# Patient Record
Sex: Male | Born: 1945 | Race: White | Hispanic: No | Marital: Married | Smoking: Former smoker
Health system: Southern US, Community
[De-identification: ages and names within clinical notes are randomized; demographics above are authoritative.]

## PROBLEM LIST (undated history)

## (undated) DIAGNOSIS — I10 Essential (primary) hypertension: Secondary | ICD-10-CM

## (undated) DIAGNOSIS — K469 Unspecified abdominal hernia without obstruction or gangrene: Secondary | ICD-10-CM

## (undated) DIAGNOSIS — G35 Multiple sclerosis: Secondary | ICD-10-CM

## (undated) DIAGNOSIS — F0390 Unspecified dementia without behavioral disturbance: Secondary | ICD-10-CM

## (undated) DIAGNOSIS — R202 Paresthesia of skin: Secondary | ICD-10-CM

## (undated) HISTORY — DX: Paresthesia of skin: R20.2

## (undated) HISTORY — DX: Essential (primary) hypertension: I10

## (undated) HISTORY — DX: Unspecified abdominal hernia without obstruction or gangrene: K46.9

## (undated) HISTORY — DX: Multiple sclerosis: G35

---

## 2001-10-09 HISTORY — PX: GALLBLADDER SURGERY: SHX652

## 2002-04-04 ENCOUNTER — Encounter: Admission: RE | Admit: 2002-04-04 | Discharge: 2002-04-04 | Payer: Self-pay | Admitting: Family Medicine

## 2002-04-04 ENCOUNTER — Encounter: Payer: Self-pay | Admitting: Family Medicine

## 2002-05-08 ENCOUNTER — Observation Stay (HOSPITAL_COMMUNITY): Admission: RE | Admit: 2002-05-08 | Discharge: 2002-05-08 | Payer: Self-pay | Admitting: *Deleted

## 2002-05-08 ENCOUNTER — Encounter (INDEPENDENT_AMBULATORY_CARE_PROVIDER_SITE_OTHER): Payer: Self-pay | Admitting: Specialist

## 2003-01-07 ENCOUNTER — Ambulatory Visit (HOSPITAL_BASED_OUTPATIENT_CLINIC_OR_DEPARTMENT_OTHER): Admission: RE | Admit: 2003-01-07 | Discharge: 2003-01-07 | Payer: Self-pay | Admitting: Plastic Surgery

## 2003-01-07 ENCOUNTER — Encounter (INDEPENDENT_AMBULATORY_CARE_PROVIDER_SITE_OTHER): Payer: Self-pay | Admitting: Specialist

## 2004-01-20 ENCOUNTER — Observation Stay (HOSPITAL_COMMUNITY): Admission: EM | Admit: 2004-01-20 | Discharge: 2004-01-21 | Payer: Self-pay | Admitting: Emergency Medicine

## 2004-03-24 ENCOUNTER — Ambulatory Visit (HOSPITAL_COMMUNITY): Admission: RE | Admit: 2004-03-24 | Discharge: 2004-03-24 | Payer: Self-pay | Admitting: Family Medicine

## 2004-03-24 ENCOUNTER — Encounter: Payer: Self-pay | Admitting: Cardiology

## 2005-11-10 ENCOUNTER — Ambulatory Visit: Payer: Self-pay | Admitting: Gastroenterology

## 2005-12-22 ENCOUNTER — Ambulatory Visit: Payer: Self-pay | Admitting: Gastroenterology

## 2007-07-08 ENCOUNTER — Encounter: Admission: RE | Admit: 2007-07-08 | Discharge: 2007-07-08 | Payer: Self-pay | Admitting: Family Medicine

## 2007-07-22 ENCOUNTER — Encounter: Admission: RE | Admit: 2007-07-22 | Discharge: 2007-07-22 | Payer: Self-pay | Admitting: Family Medicine

## 2008-01-09 ENCOUNTER — Encounter: Admission: RE | Admit: 2008-01-09 | Discharge: 2008-01-09 | Payer: Self-pay | Admitting: Family Medicine

## 2011-02-10 ENCOUNTER — Other Ambulatory Visit: Payer: Self-pay | Admitting: Dermatology

## 2011-02-24 NOTE — H&P (Signed)
NAME:  Luis Shaffer, Luis Shaffer NO.:  [id number]   MEDICAL RECORD NO.:  [id number]                   PATIENT TYPE:  INP   LOCATION:  1827                                 FACILITY:  [facility]   PHYSICIAN:  Willa Rough, M.D.                  DATE OF BIRTH:  02-Nov-1945   DATE OF ADMISSION:  01/20/2004  DATE OF DISCHARGE:                                HISTORY & PHYSICAL   REASON FOR ADMISSION:  Luis Shaffer is a pleasant 65 year old male, with no  prior history of coronary artery disease, with cardiac risk factors notable  for hypertension, history of hypercholesterolemia, remote tobacco, and  family history of coronary artery disease.  The patient states that he had a  stress Cardiolite some seven years ago at our office which was reportedly  normal.  He has not had any subsequent cardiac followup.  The patient denies  any antecedent history of exertional chest discomfort or dyspnea.  He notes  no recent change from his baseline level of exercise tolerance.   The patient presents to the emergency room, following evaluation by his  primary care physician, with complaint of four episodes of extremely brief  left breast discomfort described as a twinge.  These episodes have all  occurred at rest, one while driving back from work.  He denies any radiation  of the discomfort to the neck or upper extremities, nor does he have  associated nausea, diaphoresis, or dyspnea.  Although he has had some such  episodes in the past, he became concerned today given the increase in  frequency, but not duration.   On admission, he patient denies any chest discomfort.  Electrocardiogram  reveals normal sinus rhythm with an incomplete right bundle branch block and  no acute changes.   ALLERGIES:  No known drug allergies.   MEDICATIONS:  1. Aspirin 81 mg daily.  2. Lisinopril 5 mg daily.   PAST MEDICAL HISTORY:  1. Hypertension.  2. Multiple sclerosis diagnosed in 1998  (quiescent).  3. Status post cholecystectomy.   REVIEW OF SYSTEMS:  Negative for diabetes mellitus, thyroid disease, history  of peptic ulcer disease.  Denies any previous history of myocardial  infarction, congestive heart failure, syncope, or stroke.  Denies exertional  dyspnea, orthopnea, PND, edema, or tachy palpitations.  Denies any recent  development of fevers, chills, productive cough.  No evidence of upper or  lower GI bleeding.  Otherwise as per HPI.  Remaining systems negative.   PHYSICAL EXAMINATION:  VITAL SIGNS:  Blood pressure 146/83, pulse 96,  respirations 18, temperature 98.1, O2 95% on room air.  GENERAL:  A 65 year old man in no apparent distress.  HEENT:  Normocephalic and atraumatic.  NECK:  Preserved bilateral carotid pulses, no bruits.  LUNGS:  Clear to auscultation in all fields.  HEART:  Regular rate and rhythm (S1, S2).  No murmurs, rubs, or gallops.  ABDOMEN:  Soft, nontender.  EXTREMITIES:  Preserved bilateral femoral pulses without bruits; intact  distal pulses.  No pedal edema.  NEUROLOGIC:  No focal deficits.   LABORATORY AND X-RAY DATA:  WBC 0.9, hemoglobin 15.8, hematocrit 45,  platelets 316.  INR 1.0.  Cardiac markers pending.   Admission chest x-ray:  Pending.   IMPRESSION:  1. Atypical chest pain.  2. Multiple cardiac risk factors.     a. Hypertension.     b. History of hypercholesterolemia.     c. Remote tobacco.     d. Family history.  3. Premature ventricular contractions.   PLAN:  1. The patient will be admitted for overnight monitoring and rule out of     myocardial infarction.  2. Serial cardiac markers will be cycled, and an EKG will be repeated in the     morning.  3. In addition to his home regimen of aspirin and Lisinopril, we will start     Lopressor 25 mg b.i.d. and add Protonix as well.  The patient will be     maintained on intravenous heparin in the meantime.  4. If serial cardiac markers are negative, then plan is to  proceed with an     outpatient stress Cardiolite.  This has been scheduled for Thursday,     January 21, 2004, at 11:45 at our office.   The patient is agreeable with this plan and is willing to proceed.      Luis Shaffer, P.A. [facility]                      Willa Rough, M.D.    GS/MEDQ  D:  01/20/2004  T:  01/20/2004  Job:  [id number]   cc:   Luis Shaffer, M.D.  [address]  [place]  [place] [address]  Fax: [phone]

## 2011-02-24 NOTE — Op Note (Signed)
TNAMEJUWON, SCRIPTER                          ACCOUNT NO.:  [id number]   MEDICAL RECORD NO.:  [id number]                   PATIENT TYPE:  OBV   LOCATION:  0442                                 FACILITY:  [facility]   PHYSICIAN:  Vikki Ports, M.D.         DATE OF BIRTH:  1946/07/31   DATE OF PROCEDURE:  05/08/2002  DATE OF DISCHARGE:  05/08/2002                                 OPERATIVE REPORT   PREOPERATIVE DIAGNOSIS:  Symptomatic cholelithiasis.   POSTOPERATIVE DIAGNOSIS:  Symptomatic cholelithiasis, acute cholecystitis.   SURGEON:  Vikki Ports, M.D.   ASSISTANT:  Currie Paris, M.D.   PROCEDURE:  Laparoscopic cholecystectomy with intraoperative cholangiogram.   ANESTHESIA:  General.   DESCRIPTION OF PROCEDURE:  The patient was taken to the operating room,  placed in the supine position.  After adequate anesthesia was induced using  an endotracheal tube, the abdomen was prepped and draped in a normal sterile  fashion.  Using a transverse infraumbilical incision I dissected down to the  fascia.  The fascia was opened vertically and a 0 Vicryl pursestring suture  was placed on the fascial defect.  A Hasson trocar was placed in the abdomen  and the abdomen was insufflated with carbon dioxide to a measurement of 15  mmHg.  Under direct visualization a 10 mm port was placed in the subxiphoid  region; two 5 mm ports were placed in the right abdomen.  The gallbladder  was identified and retracted cephalad.  The gallbladder was very scarred,  contracted, and thick-walled.  There were a number of omental adhesions  which had to be taken down, and the infundibulum was identified and  retracted laterally.  The cystic duct was identified with a good window  created posterior to it, identifying the cystic artery.  Posterior to the  cystic artery a good window was created to the liver bed.  The cystic artery  was triple-clipped and divided, the cystic duct was clipped  up near the  gallbladder, a small ductotomy was made, and cholangiogram was performed.  This showed normal anatomy and no filling defects.  The cystic duct was then  triple-clipped and divided.  The gallbladder was then taken off the  gallbladder bed using Bovie electrocautery.  It was quite intrahepatic, and  on entering the gallbladder a number of stones fell out of the gallbladder.  These were retrieved with a stone scoop and with irrigation.  The  gallbladder was placed in an EndoCatch bag and removed from the umbilical  port.  Adequate hemostasis was ensured.  Pneumoperitoneum was released.  The  infraumbilical fascial defect was closed with the 0 Vicryl pursestring  suture, skin incisions were closed with staples.  The patient tolerated the  procedure well and went to PACU in good condition.  Vikki Ports, M.D.    [facility]/MEDQ  D:  05/09/2002  T:  05/15/2002  Job:  [id number]   cc:   [place]ale [place]. Andrey Campanile, M.D.

## 2011-02-24 NOTE — Discharge Summary (Signed)
Luis Shaffer, Luis Shaffer NO.:  [id number]   MEDICAL RECORD NO.:  [id number]                   PATIENT TYPE:  INP   LOCATION:  4742                                 FACILITY:  [facility]   PHYSICIAN:  Willa Rough, M.D.                  DATE OF BIRTH:  01/21/1946   DATE OF ADMISSION:  01/20/2004  DATE OF DISCHARGE:  01/21/2004                           DISCHARGE SUMMARY - REFERRING   PROCEDURE:  None.   REASON FOR ADMISSION:  Please refer to admission note.   LABORATORY DATA:  Cardiac enzymes; CK-MB and troponin I markers normal (x3).  Electrolytes, renal function, and liver enzymes normal.  CBC normal. Fasting  lipid profile pending.   Admission chest x-ray; mild cardiac enlargement with no acute changes.   HOSPITAL COURSE:  Following presentation to the emergency room for  evaluation of recurrent, left-sided chest discomfort, the patient was  admitted for overnight observation to rule out myocardial infarction.  He  was maintained on aspirin, beta blocker, and intravenous heparin.   Serial cardiac enzymes were all within normal limits.  Repeat  electrocardiogram showed no acute changes.   The patient was cleared for discharge with plans to proceed with scheduled  exercise stress Cardiolite on the morning of discharge at [facility]  at 11:45 a.m.   The patient will be discharged on previous home medication regimen, with the  addition of Protonix.   DISCHARGE INSTRUCTIONS:  Proceed directly to  [facility] for  scheduled stress Cardiolite at 11:30 a.m.  Follow up with Dr. Andrey Campanile in one  to two weeks.   DISCHARGE DIAGNOSES:  1. Atypical chest pain.     A. Normal serial cardiac enzymes.     B. Follow-up outpatient stress Cardiolite, April 14.  2. Hypertension.  3. History of hypercholesterolemia.  4. Asymptomatic premature ventricular contractions.      Gene Serpe, P.A. [facility]                      Willa Rough, M.D.    GS/MEDQ  D:   01/21/2004  T:  01/21/2004  Job:  [id number]   cc:   Vale Haven. Andrey Campanile, M.D.  [address]  [place]  [place] [address]  Fax: [phone]

## 2011-11-22 DIAGNOSIS — Z125 Encounter for screening for malignant neoplasm of prostate: Secondary | ICD-10-CM | POA: Diagnosis not present

## 2011-11-22 DIAGNOSIS — Z79899 Other long term (current) drug therapy: Secondary | ICD-10-CM | POA: Diagnosis not present

## 2011-11-22 DIAGNOSIS — R079 Chest pain, unspecified: Secondary | ICD-10-CM | POA: Diagnosis not present

## 2011-11-22 DIAGNOSIS — E669 Obesity, unspecified: Secondary | ICD-10-CM | POA: Diagnosis not present

## 2011-11-22 DIAGNOSIS — Z0000 Encounter for general adult medical examination without abnormal findings: Secondary | ICD-10-CM | POA: Diagnosis not present

## 2011-11-22 DIAGNOSIS — B351 Tinea unguium: Secondary | ICD-10-CM | POA: Diagnosis not present

## 2011-11-22 DIAGNOSIS — I10 Essential (primary) hypertension: Secondary | ICD-10-CM | POA: Diagnosis not present

## 2011-12-01 DIAGNOSIS — B351 Tinea unguium: Secondary | ICD-10-CM | POA: Diagnosis not present

## 2011-12-01 DIAGNOSIS — M79609 Pain in unspecified limb: Secondary | ICD-10-CM | POA: Diagnosis not present

## 2012-05-22 DIAGNOSIS — I10 Essential (primary) hypertension: Secondary | ICD-10-CM | POA: Diagnosis not present

## 2012-05-22 DIAGNOSIS — M62838 Other muscle spasm: Secondary | ICD-10-CM | POA: Diagnosis not present

## 2012-05-22 DIAGNOSIS — K219 Gastro-esophageal reflux disease without esophagitis: Secondary | ICD-10-CM | POA: Diagnosis not present

## 2012-06-13 DIAGNOSIS — H353190 Nonexudative age-related macular degeneration, unspecified eye, stage unspecified: Secondary | ICD-10-CM | POA: Diagnosis not present

## 2012-06-13 DIAGNOSIS — H538 Other visual disturbances: Secondary | ICD-10-CM | POA: Diagnosis not present

## 2012-06-13 DIAGNOSIS — H2510 Age-related nuclear cataract, unspecified eye: Secondary | ICD-10-CM | POA: Diagnosis not present

## 2012-06-28 DIAGNOSIS — H35369 Drusen (degenerative) of macula, unspecified eye: Secondary | ICD-10-CM | POA: Diagnosis not present

## 2012-06-28 DIAGNOSIS — H43819 Vitreous degeneration, unspecified eye: Secondary | ICD-10-CM | POA: Diagnosis not present

## 2012-06-28 DIAGNOSIS — H353190 Nonexudative age-related macular degeneration, unspecified eye, stage unspecified: Secondary | ICD-10-CM | POA: Diagnosis not present

## 2012-06-28 DIAGNOSIS — H2510 Age-related nuclear cataract, unspecified eye: Secondary | ICD-10-CM | POA: Diagnosis not present

## 2012-08-09 DIAGNOSIS — L821 Other seborrheic keratosis: Secondary | ICD-10-CM | POA: Diagnosis not present

## 2012-08-09 DIAGNOSIS — L570 Actinic keratosis: Secondary | ICD-10-CM | POA: Diagnosis not present

## 2012-11-25 ENCOUNTER — Other Ambulatory Visit: Payer: Self-pay | Admitting: Geriatric Medicine

## 2012-11-25 DIAGNOSIS — Z0000 Encounter for general adult medical examination without abnormal findings: Secondary | ICD-10-CM | POA: Diagnosis not present

## 2012-11-25 DIAGNOSIS — I10 Essential (primary) hypertension: Secondary | ICD-10-CM | POA: Diagnosis not present

## 2012-11-25 DIAGNOSIS — Z79899 Other long term (current) drug therapy: Secondary | ICD-10-CM | POA: Diagnosis not present

## 2012-11-25 DIAGNOSIS — E041 Nontoxic single thyroid nodule: Secondary | ICD-10-CM | POA: Diagnosis not present

## 2012-11-25 DIAGNOSIS — Z1331 Encounter for screening for depression: Secondary | ICD-10-CM | POA: Diagnosis not present

## 2012-11-28 ENCOUNTER — Ambulatory Visit
Admission: RE | Admit: 2012-11-28 | Discharge: 2012-11-28 | Disposition: A | Discharge status: Home / Self Care | Payer: Medicare Other | Source: Ambulatory Visit | Attending: Geriatric Medicine | Admitting: Geriatric Medicine

## 2012-11-28 DIAGNOSIS — E042 Nontoxic multinodular goiter: Secondary | ICD-10-CM | POA: Diagnosis not present

## 2012-11-28 DIAGNOSIS — E041 Nontoxic single thyroid nodule: Secondary | ICD-10-CM

## 2013-01-03 DIAGNOSIS — H353190 Nonexudative age-related macular degeneration, unspecified eye, stage unspecified: Secondary | ICD-10-CM | POA: Diagnosis not present

## 2013-01-03 DIAGNOSIS — H35379 Puckering of macula, unspecified eye: Secondary | ICD-10-CM | POA: Diagnosis not present

## 2013-01-03 DIAGNOSIS — H2510 Age-related nuclear cataract, unspecified eye: Secondary | ICD-10-CM | POA: Diagnosis not present

## 2013-02-17 DIAGNOSIS — L821 Other seborrheic keratosis: Secondary | ICD-10-CM | POA: Diagnosis not present

## 2013-02-17 DIAGNOSIS — D2330 Other benign neoplasm of skin of unspecified part of face: Secondary | ICD-10-CM | POA: Diagnosis not present

## 2013-02-17 DIAGNOSIS — L570 Actinic keratosis: Secondary | ICD-10-CM | POA: Diagnosis not present

## 2013-02-17 DIAGNOSIS — D239 Other benign neoplasm of skin, unspecified: Secondary | ICD-10-CM | POA: Diagnosis not present

## 2013-05-19 DIAGNOSIS — R109 Unspecified abdominal pain: Secondary | ICD-10-CM | POA: Diagnosis not present

## 2013-05-19 DIAGNOSIS — I10 Essential (primary) hypertension: Secondary | ICD-10-CM | POA: Diagnosis not present

## 2013-05-19 DIAGNOSIS — G35 Multiple sclerosis: Secondary | ICD-10-CM | POA: Diagnosis not present

## 2013-05-19 DIAGNOSIS — R209 Unspecified disturbances of skin sensation: Secondary | ICD-10-CM | POA: Diagnosis not present

## 2013-05-19 DIAGNOSIS — M549 Dorsalgia, unspecified: Secondary | ICD-10-CM | POA: Diagnosis not present

## 2013-06-12 ENCOUNTER — Encounter: Payer: Self-pay | Admitting: Neurology

## 2013-06-12 ENCOUNTER — Ambulatory Visit (INDEPENDENT_AMBULATORY_CARE_PROVIDER_SITE_OTHER): Payer: Medicare Other | Admitting: Neurology

## 2013-06-12 VITALS — BP 160/102 | HR 86 | Ht 68.0 in | Wt 205.0 lb

## 2013-06-12 DIAGNOSIS — K469 Unspecified abdominal hernia without obstruction or gangrene: Secondary | ICD-10-CM | POA: Insufficient documentation

## 2013-06-12 DIAGNOSIS — G35 Multiple sclerosis: Secondary | ICD-10-CM | POA: Diagnosis not present

## 2013-06-12 DIAGNOSIS — R202 Paresthesia of skin: Secondary | ICD-10-CM | POA: Insufficient documentation

## 2013-06-12 DIAGNOSIS — E079 Disorder of thyroid, unspecified: Secondary | ICD-10-CM | POA: Insufficient documentation

## 2013-06-12 NOTE — Progress Notes (Signed)
[facility]  PATIENT: Luis Shaffer DOB: 1946-08-23  HISTORICAL  Luis Shaffer is a 67 years old right-handed Caucasian male, referred by his primary care physician Dr. Pete Shaffer for evaluation of multiple sclerosis.  He had past medical history of hypertension, is taking Lisinopril and aspirin,  The diagnosis of multiple sclerosis was made in 1998 by neurologist Dr. 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  Then he lost followup on to September of 2008, he was reading in bed one night, turn off the light, realized that he has lost vision in his left eye, only lasted less than 2 minutes, his vision went back to normal next morning, he was evaluated by Luis Shaffer in November 2008, repeat MRI of the brain showed mild increased lesion load, but he was still highly functional, no visual difficulty, there was no other clinical flareup, visual evoked potential was normal, ultrasound of carotid artery was normal  After discussion,  He worries about the potential side effect of disease modifying medication, he elected not to receive treatment.  He has been doing very well over the past 6 years, no recurrent neurological symptoms, retired in 2012, still highly function at home, driving, exercise regularly without difficulty.  He denied dysarthria, dysphagia or excessive fatigue  He wants to establish neurologic care,  with more options for multiple sclerosis treatment now, he wants to see if he needs to be on any treatment at all,   REVIEW OF SYSTEMS: Full 14 system review of systems performed and notable only for ringing in ears, rash, constipation, numbness, snoring  ALLERGIES: No Known Allergies  HOME MEDICATIONS:  lisinopril ASA   PAST MEDICAL HISTORY: Past Medical History  Diagnosis Date  . Multiple sclerosis   . Paresthesia   . High blood pressure   . Hernia     PAST SURGICAL HISTORY: Past Surgical History  Procedure Laterality Date  . Gallbladder surgery  2003    FAMILY HISTORY: Family History  Problem Relation Age of Onset  . Breast cancer Mother   . Bone cancer Mother   . Kidney failure Father   CAD      Father  SOCIAL HISTORY:  History   Social History  . Marital Status: Married    Spouse Name: Luis Shaffer    Number of Children: 0  . Years of Education: college   Occupational History    Retired   As Civil Service fast streamer, Doctor, hospital job   Social History Main Topics  . Smoking status: Former Smoker    Types: Cigarettes  . Smokeless tobacco: Never Used  . Alcohol Use: 0.6 oz/week    1 Shots of liquor per week  . Drug Use: No  . Sexual Activity: Not on file    Social History Narrative   Patient lives at home with his wife Luis Shaffer). Patient is retired. College education.   Right handed.   Caffeine- one cup of coffee daily.   PHYSICAL EXAM  Filed Vitals:   06/12/13 0838  BP: 160/102  Pulse: 86  Height: 5\' 8"  (1.727 m)  Weight: 205 lb (92.987 kg)    Body mass index is 31.18 kg/(m^2).   Generalized: In no acute distress  Neck: Supple, no carotid bruits   Cardiac: Regular rate rhythm  Pulmonary: Clear to auscultation bilaterally  Musculoskeletal: No deformity  Neurological examination  Mentation: Alert oriented to time, place, history taking, and causual conversation  Cranial nerve II-XII: Pupils were equal round reactive to light  extraocular movements were full, visual field were full on confrontational test. facial sensation and strength were normal. hearing was intact to finger rubbing bilaterally. Uvula tongue midline.  head turning and shoulder shrug and were normal and symmetric.Tongue protrusion into cheek strength was normal.  Motor: normal tone, bulk and strength.  Sensory: Intact to fine touch, pinprick, preserved vibratory sensation, and proprioception at toes.  Coordination: Normal finger to nose, heel-to-shin bilaterally there was no truncal ataxia  Gait: Rising up from seated position without assistance, normal stance, without trunk ataxia, moderate stride, good arm swing, smooth turning, able to perform tiptoe, and heel walking without difficulty.   Romberg signs: Negative  Deep tendon reflexes: Brachioradialis 2/2, biceps 2/2, triceps 2/2, patellar 2/2, Achilles 2/2, plantar responses were flexor bilaterally.   DIAGNOSTIC DATA (LABS, IMAGING, TESTING) - I reviewed patient records, labs, notes, testing and imaging myself where available.     ASSESSMENT AND PLAN  67 years old right-handed Caucasian male, with past medical history of hypertension, diagnosis of definite multiple sclerosis since 1998, clinically, he has been very stable, only mild progression in repeat MRI of brain in 2008, he still highly function, never received any disease modifying medications.  1.  Repeat MRI brain w/wo contrast. 2. will call him report, if there is significant change on repeat MRI of the brain, he will come back to discuss treatment option, otherwise it is okay for him to keep off disease modifying medication at this point 3. please check vitamin B12, thyroid functional, vitamin D. level on next yearly followup    Luis Shaffer, M.D. Ph.D.  [facility] [address] [place], [place] [address] [phone]

## 2013-06-23 ENCOUNTER — Telehealth: Payer: Self-pay | Admitting: Neurology

## 2013-06-23 NOTE — Telephone Encounter (Signed)
Dr Terrace Arabia. Okay to dispense Xanax for MRI?  Please advise.  Thank  You.

## 2013-06-23 NOTE — Telephone Encounter (Signed)
Yes, it is Ok to dispense xanax.

## 2013-06-24 ENCOUNTER — Telehealth: Payer: Self-pay | Admitting: Neurology

## 2013-06-25 NOTE — Telephone Encounter (Signed)
Please see previous note.  Xanax ok to pick up.

## 2013-06-29 ENCOUNTER — Ambulatory Visit
Admission: RE | Admit: 2013-06-29 | Discharge: 2013-06-29 | Disposition: A | Discharge status: Home / Self Care | Payer: Medicare Other | Source: Ambulatory Visit | Attending: Neurology | Admitting: Neurology

## 2013-06-29 DIAGNOSIS — G35 Multiple sclerosis: Secondary | ICD-10-CM

## 2013-06-29 MED ORDER — GADOBENATE DIMEGLUMINE 529 MG/ML IV SOLN
19.0000 mL | Freq: Once | INTRAVENOUS | Status: AC | PRN
  Administered 2013-06-29: 19 mL via INTRAVENOUS

## 2013-07-01 ENCOUNTER — Telehealth: Payer: Self-pay | Admitting: Neurology

## 2013-07-01 NOTE — Telephone Encounter (Signed)
I have called him, MRI brain showed scattered periventricular subcortical cerebellar white matter lesions compatible with multiple sclerosis.  No enhancing lesions are noted. Compared with previous MRI scan from 07/08/2007, lesions appear unchanged with the  except the left parietal periventricular lesion appears to be new. The pituitary gland appears to be slightly enlarged compared with the previous MRI as well.  I have discussed with Luis Shaffer, only one new lesion compared to previous scan in 2008, not enhancing, he is stable clinically.  We elected not to start disease modify treatment, he will call back 2 weeks before Sep 2015 appointment to have a repeat MRI brain scan.

## 2013-08-14 ENCOUNTER — Other Ambulatory Visit: Payer: Self-pay

## 2013-11-26 ENCOUNTER — Other Ambulatory Visit: Payer: Self-pay | Admitting: Geriatric Medicine

## 2013-11-26 ENCOUNTER — Ambulatory Visit
Admission: RE | Admit: 2013-11-26 | Discharge: 2013-11-26 | Disposition: A | Discharge status: Home / Self Care | Payer: Medicare Other | Source: Ambulatory Visit | Attending: Geriatric Medicine | Admitting: Geriatric Medicine

## 2013-11-26 DIAGNOSIS — Z125 Encounter for screening for malignant neoplasm of prostate: Secondary | ICD-10-CM | POA: Diagnosis not present

## 2013-11-26 DIAGNOSIS — E049 Nontoxic goiter, unspecified: Secondary | ICD-10-CM

## 2013-11-26 DIAGNOSIS — M7020 Olecranon bursitis, unspecified elbow: Secondary | ICD-10-CM | POA: Diagnosis not present

## 2013-11-26 DIAGNOSIS — H6120 Impacted cerumen, unspecified ear: Secondary | ICD-10-CM | POA: Diagnosis not present

## 2013-11-26 DIAGNOSIS — Z79899 Other long term (current) drug therapy: Secondary | ICD-10-CM | POA: Diagnosis not present

## 2013-11-26 DIAGNOSIS — Z1331 Encounter for screening for depression: Secondary | ICD-10-CM | POA: Diagnosis not present

## 2013-11-26 DIAGNOSIS — E042 Nontoxic multinodular goiter: Secondary | ICD-10-CM | POA: Diagnosis not present

## 2013-11-26 DIAGNOSIS — Z0000 Encounter for general adult medical examination without abnormal findings: Secondary | ICD-10-CM | POA: Diagnosis not present

## 2013-11-26 DIAGNOSIS — E041 Nontoxic single thyroid nodule: Secondary | ICD-10-CM | POA: Diagnosis not present

## 2013-11-26 DIAGNOSIS — Z23 Encounter for immunization: Secondary | ICD-10-CM | POA: Diagnosis not present

## 2013-11-26 DIAGNOSIS — H9319 Tinnitus, unspecified ear: Secondary | ICD-10-CM | POA: Diagnosis not present

## 2013-12-29 ENCOUNTER — Other Ambulatory Visit: Payer: Self-pay | Admitting: Internal Medicine

## 2013-12-29 ENCOUNTER — Ambulatory Visit
Admission: RE | Admit: 2013-12-29 | Discharge: 2013-12-29 | Disposition: A | Discharge status: Home / Self Care | Payer: Medicare Other | Source: Ambulatory Visit | Attending: Internal Medicine | Admitting: Internal Medicine

## 2013-12-29 DIAGNOSIS — R05 Cough: Secondary | ICD-10-CM

## 2013-12-29 DIAGNOSIS — J9819 Other pulmonary collapse: Secondary | ICD-10-CM | POA: Diagnosis not present

## 2013-12-29 DIAGNOSIS — R059 Cough, unspecified: Secondary | ICD-10-CM

## 2014-02-17 DIAGNOSIS — D1801 Hemangioma of skin and subcutaneous tissue: Secondary | ICD-10-CM | POA: Diagnosis not present

## 2014-02-17 DIAGNOSIS — L821 Other seborrheic keratosis: Secondary | ICD-10-CM | POA: Diagnosis not present

## 2014-02-17 DIAGNOSIS — L820 Inflamed seborrheic keratosis: Secondary | ICD-10-CM | POA: Diagnosis not present

## 2014-06-01 ENCOUNTER — Telehealth: Payer: Self-pay | Admitting: Neurology

## 2014-06-01 NOTE — Telephone Encounter (Signed)
Patient is calling--he has an appointment scheduled with Dr. Krista Blue on 06-18-14 and is supposed to have an MRI scheduled before that visit--please call.

## 2014-06-02 NOTE — Telephone Encounter (Addendum)
Called and spoke to patient CX His follow up because MRI has not been scheduled yet. Patient will need Xanax Pack for MRI. Please call when xanax can be picked up. Renee Please let me know when scheduled so I can schedule him a follow up with Dr.Yan to go over MRI results. Patient aware Dr.Yan is out of the office until 06-03-2014.  Patient states he has MRI every year. Dr.Yan please place orders.

## 2014-06-02 NOTE — Telephone Encounter (Signed)
Patient Called back and stated for now he wants to hold off on MRI because his wife has Breast Cancer. Patient will call back when he is ready to schedule his MRI/ Follow Up. Patient Wants Dr.Yan to be aware he is holding off.

## 2014-06-03 NOTE — Telephone Encounter (Signed)
Reviewed chart, he has MS. Not on any disease modifying medications

## 2014-06-12 ENCOUNTER — Ambulatory Visit: Payer: Medicare Other | Admitting: Nurse Practitioner

## 2014-06-12 ENCOUNTER — Ambulatory Visit: Payer: Medicare Other | Admitting: Neurology

## 2014-06-18 ENCOUNTER — Ambulatory Visit: Payer: Medicare Other | Admitting: Neurology

## 2014-06-24 DIAGNOSIS — I10 Essential (primary) hypertension: Secondary | ICD-10-CM | POA: Diagnosis not present

## 2014-06-24 DIAGNOSIS — N410 Acute prostatitis: Secondary | ICD-10-CM | POA: Diagnosis not present

## 2014-06-24 DIAGNOSIS — Z23 Encounter for immunization: Secondary | ICD-10-CM | POA: Diagnosis not present

## 2014-06-24 DIAGNOSIS — E669 Obesity, unspecified: Secondary | ICD-10-CM | POA: Diagnosis not present

## 2014-06-24 DIAGNOSIS — Z6829 Body mass index (BMI) 29.0-29.9, adult: Secondary | ICD-10-CM | POA: Diagnosis not present

## 2014-06-27 DIAGNOSIS — H524 Presbyopia: Secondary | ICD-10-CM | POA: Diagnosis not present

## 2014-06-27 DIAGNOSIS — H2510 Age-related nuclear cataract, unspecified eye: Secondary | ICD-10-CM | POA: Diagnosis not present

## 2014-06-27 DIAGNOSIS — H353190 Nonexudative age-related macular degeneration, unspecified eye, stage unspecified: Secondary | ICD-10-CM | POA: Diagnosis not present

## 2014-06-27 DIAGNOSIS — H5015 Alternating exotropia: Secondary | ICD-10-CM | POA: Diagnosis not present

## 2014-07-15 DIAGNOSIS — R3912 Poor urinary stream: Secondary | ICD-10-CM | POA: Diagnosis not present

## 2014-07-15 DIAGNOSIS — N401 Enlarged prostate with lower urinary tract symptoms: Secondary | ICD-10-CM | POA: Diagnosis not present

## 2014-07-15 DIAGNOSIS — R350 Frequency of micturition: Secondary | ICD-10-CM | POA: Diagnosis not present

## 2014-07-15 DIAGNOSIS — R3915 Urgency of urination: Secondary | ICD-10-CM | POA: Diagnosis not present

## 2014-07-24 ENCOUNTER — Other Ambulatory Visit: Payer: Self-pay

## 2014-11-13 ENCOUNTER — Encounter (HOSPITAL_COMMUNITY): Payer: Self-pay | Admitting: Emergency Medicine

## 2014-11-13 ENCOUNTER — Emergency Department (HOSPITAL_COMMUNITY)
Admission: EM | Admit: 2014-11-13 | Discharge: 2014-11-14 | Disposition: A | Discharge status: Home / Self Care | Payer: Medicare Other | Attending: Emergency Medicine | Admitting: Emergency Medicine

## 2014-11-13 DIAGNOSIS — Z79899 Other long term (current) drug therapy: Secondary | ICD-10-CM | POA: Diagnosis not present

## 2014-11-13 DIAGNOSIS — Z9889 Other specified postprocedural states: Secondary | ICD-10-CM | POA: Diagnosis not present

## 2014-11-13 DIAGNOSIS — Z87891 Personal history of nicotine dependence: Secondary | ICD-10-CM | POA: Diagnosis not present

## 2014-11-13 DIAGNOSIS — G35 Multiple sclerosis: Secondary | ICD-10-CM | POA: Insufficient documentation

## 2014-11-13 DIAGNOSIS — K5901 Slow transit constipation: Secondary | ICD-10-CM | POA: Insufficient documentation

## 2014-11-13 DIAGNOSIS — K5641 Fecal impaction: Secondary | ICD-10-CM | POA: Diagnosis not present

## 2014-11-13 DIAGNOSIS — R339 Retention of urine, unspecified: Secondary | ICD-10-CM | POA: Diagnosis not present

## 2014-11-13 DIAGNOSIS — R159 Full incontinence of feces: Secondary | ICD-10-CM | POA: Diagnosis present

## 2014-11-13 DIAGNOSIS — Z7982 Long term (current) use of aspirin: Secondary | ICD-10-CM | POA: Diagnosis not present

## 2014-11-13 LAB — BASIC METABOLIC PANEL
ANION GAP: 8 (ref 5–15)
BUN: 17 mg/dL (ref 6–23)
CALCIUM: 9.3 mg/dL (ref 8.4–10.5)
CHLORIDE: 105 mmol/L (ref 96–112)
CO2: 23 mmol/L (ref 19–32)
CREATININE: 1.03 mg/dL (ref 0.50–1.35)
GFR calc Af Amer: 83 mL/min — ABNORMAL LOW (ref >90)
GFR calc non Af Amer: 72 mL/min — ABNORMAL LOW (ref >90)
GLUCOSE: 125 mg/dL — AB (ref 70–99)
POTASSIUM: 3.9 mmol/L (ref 3.5–5.1)
SODIUM: 136 mmol/L (ref 135–145)

## 2014-11-13 LAB — URINALYSIS, ROUTINE W REFLEX MICROSCOPIC
BILIRUBIN URINE: NEGATIVE
GLUCOSE, UA: NEGATIVE mg/dL
KETONES UR: NEGATIVE mg/dL
LEUKOCYTES UA: NEGATIVE
NITRITE: NEGATIVE
PH: 5 (ref 5.0–8.0)
Protein, ur: NEGATIVE mg/dL
Specific Gravity, Urine: 1.02 (ref 1.005–1.030)
UROBILINOGEN UA: 0.2 mg/dL (ref 0.0–1.0)

## 2014-11-13 LAB — CBC WITH DIFFERENTIAL/PLATELET
BASOS PCT: 0 % (ref 0–1)
Basophils Absolute: 0 10*3/uL (ref 0.0–0.1)
EOS ABS: 0 10*3/uL (ref 0.0–0.7)
EOS PCT: 0 % (ref 0–5)
HEMATOCRIT: 47.8 % (ref 39.0–52.0)
Hemoglobin: 16.7 g/dL (ref 13.0–17.0)
LYMPHS ABS: 1.2 10*3/uL (ref 0.7–4.0)
Lymphocytes Relative: 7 % — ABNORMAL LOW (ref 12–46)
MCH: 33.5 pg (ref 26.0–34.0)
MCHC: 34.9 g/dL (ref 30.0–36.0)
MCV: 95.8 fL (ref 78.0–100.0)
MONO ABS: 1.2 10*3/uL — AB (ref 0.1–1.0)
Monocytes Relative: 7 % (ref 3–12)
Neutro Abs: 14.9 10*3/uL — ABNORMAL HIGH (ref 1.7–7.7)
Neutrophils Relative %: 86 % — ABNORMAL HIGH (ref 43–77)
PLATELETS: 304 10*3/uL (ref 150–400)
RBC: 4.99 MIL/uL (ref 4.22–5.81)
RDW: 13 % (ref 11.5–15.5)
WBC: 17.3 10*3/uL — AB (ref 4.0–10.5)

## 2014-11-13 LAB — URINE MICROSCOPIC-ADD ON

## 2014-11-13 MED ORDER — LIDOCAINE HCL 4 % EX SOLN
Freq: Once | CUTANEOUS | Status: DC
  Filled 2014-11-13: qty 50

## 2014-11-13 NOTE — ED Provider Notes (Signed)
CSN: [id number]     Arrival date & time 11/13/14  1752 History   First MD Initiated Contact with Patient 11/13/14 1842     Chief Complaint  Patient presents with  . Bowel Incontinence   . Urinary Retention     (Consider location/radiation/quality/duration/timing/severity/associated sxs/prior Treatment) The history is provided by the patient.     Luis Shaffer is a 69 y.o. male who is here for evaluation of difficulty urinating for about 12 hours, and fecal constipation.  The symptoms have been ongoing and progressive for 2 days.  Has not had fever, chills, vomiting, cough, shortness breath, chest pain or abdominal pain.  He has a history of chronic constipation and he takes Senokot- S, about once a month for it.  He has not tried any other treatment.  There are no other known modifying factors.   Past Medical History  Diagnosis Date  . Multiple sclerosis   . Paresthesia   . High blood pressure   . Hernia    Past Surgical History  Procedure Laterality Date  . Gallbladder surgery  2003   Family History  Problem Relation Age of Onset  . Breast cancer Mother   . Bone cancer Mother   . Kidney failure Father    History  Substance Use Topics  . Smoking status: Former Smoker    Types: Cigarettes  . Smokeless tobacco: Never Used     Comment: Quit 40 years ago  . Alcohol Use: 0.6 oz/week    1 Shots of liquor per week     Comment: one drink daily.    Review of Systems  All other systems reviewed and are negative.     Allergies  Review of patient's allergies indicates no known allergies.  Home Medications   Prior to Admission medications   Medication Sig Start Date End Date Taking? Authorizing Provider  aspirin 81 MG tablet Take 81 mg by mouth daily.   Yes Historical Provider, MD  lisinopril (PRINIVIL,ZESTRIL) 20 MG tablet Take 10 mg by mouth daily.  04/29/13  Yes Historical Provider, MD  Multiple Vitamins-Minerals (MULTIVITAMIN PO) Take by mouth daily.   Yes  Historical Provider, MD  Multiple Vitamins-Minerals (PRESERVISION AREDS PO) Take by mouth daily.   Yes Historical Provider, MD  Omega-3 Fatty Acids (FISH OIL) 500 MG CAPS Take by mouth 4 (four) times daily.    Yes Historical Provider, MD  SF 1.1 % GEL dental gel Place 1 application onto teeth at bedtime.  06/02/13  Yes Historical Provider, MD   BP 136/76 mmHg  Pulse 107  Temp(Src) 97.8 F (36.6 C) (Oral)  Resp 18  SpO2 95% Physical Exam  Constitutional: He is oriented to person, place, and time. He appears well-developed and well-nourished.  HENT:  Head: Normocephalic and atraumatic.  Right Ear: External ear normal.  Left Ear: External ear normal.  Eyes: Conjunctivae and EOM are normal. Pupils are equal, round, and reactive to light.  Neck: Normal range of motion and phonation normal. Neck supple.  Cardiovascular: Normal rate, regular rhythm and normal heart sounds.   Pulmonary/Chest: Effort normal and breath sounds normal. He exhibits no bony tenderness.  Abdominal: Soft. There is no tenderness.  Genitourinary:  Normal anus.  Fecal impaction in rectum with brown stool  Musculoskeletal: Normal range of motion.  Neurological: He is alert and oriented to person, place, and time. No cranial nerve deficit or sensory deficit. He exhibits normal muscle tone. Coordination normal.  Skin: Skin is warm, dry and intact.  Psychiatric: He has a normal mood and affect. His behavior is normal. Judgment and thought content normal.  Nursing note and vitals reviewed.   ED Course  Procedures (including critical care time)  Medications - No data to display  Patient Vitals for the past 24 hrs:  BP Temp Temp src Pulse Resp SpO2  11/14/14 0140 136/76 mmHg - - 107 18 95 %  11/13/14 2230 (!) 128/54 mmHg - - 104 21 96 %  11/13/14 2028 137/75 mmHg - - 109 25 97 %  11/13/14 1833 - 97.8 F (36.6 C) Oral - - -  11/13/14 1808 123/75 mmHg - - (!) 122 18 95 %    21:40- after insertion of Xylocaine jelly,  was manually disimpacted, by me.  Moderate amount of hard stool was recovered.  There was gross blood on some of the more proximal stool, noted during the disimpaction.    11:56 PM Reevaluation with update and discussion. After initial assessment and treatment, an updated evaluation reveals after the substance enema, he had minimal relief of stool.  I repeated the digital rectal exam.  There is no low fecal impaction but there continues to be hard stool palpated at the extent of my reach.  Second enema ordered. [place] Review Labs Reviewed  BASIC METABOLIC PANEL - Abnormal; Notable for the following:    Glucose, Bld 125 (*)    GFR calc non Af Amer 72 (*)    GFR calc Af Amer 83 (*)    All other components within normal limits  CBC WITH DIFFERENTIAL/PLATELET - Abnormal; Notable for the following:    WBC 17.3 (*)    Neutrophils Relative % 86 (*)    Neutro Abs 14.9 (*)    Lymphocytes Relative 7 (*)    Monocytes Absolute 1.2 (*)    All other components within normal limits  URINALYSIS, ROUTINE W REFLEX MICROSCOPIC - Abnormal; Notable for the following:    Hgb urine dipstick TRACE (*)    All other components within normal limits  URINE CULTURE  URINE MICROSCOPIC-ADD ON    Imaging Review No results found.   EKG Interpretation None      MDM   Final diagnoses:  Fecal impaction  Slow transit constipation    Constipation with fecal impaction. Pt., required multiple enemas to improve stool output.   Nursing Notes Reviewed/ Care Coordinated Applicable Imaging Reviewed Interpretation of Laboratory Data incorporated into ED treatment  Care to Dr. Florina Ou, to consider d/c after second enema; at shift change.    Richarda Blade, MD 11/14/14 1230

## 2014-11-13 NOTE — ED Notes (Addendum)
Pt with Hx of urinary frequency c/o being unable to void from last night until just now when he succeeded in voiding, also c/o constipation and pain in his rectum when he sits and minor incontinence of brown feces-scented liquid from the rectum when he sits. Pt states he also managed to have a small incontinent bowel movement in the waiting room. Pt is normally continent of bowel and bladder.

## 2014-11-14 NOTE — ED Provider Notes (Signed)
1:48 AM Patient feeling significant relief after large bowel movement. Wishes to go home at this time.  Wynetta Fines, MD 11/14/14 [phone]

## 2014-11-14 NOTE — ED Notes (Signed)
Per pt he had a BM and no longer feels any pressure on his rectum.

## 2014-11-14 NOTE — ED Notes (Signed)
Pt is sitting on the bedside commode

## 2014-11-15 LAB — URINE CULTURE: SPECIAL REQUESTS: NORMAL

## 2014-12-02 DIAGNOSIS — I10 Essential (primary) hypertension: Secondary | ICD-10-CM | POA: Diagnosis not present

## 2014-12-02 DIAGNOSIS — Z0000 Encounter for general adult medical examination without abnormal findings: Secondary | ICD-10-CM | POA: Diagnosis not present

## 2014-12-02 DIAGNOSIS — G35 Multiple sclerosis: Secondary | ICD-10-CM | POA: Diagnosis not present

## 2014-12-02 DIAGNOSIS — Z79899 Other long term (current) drug therapy: Secondary | ICD-10-CM | POA: Diagnosis not present

## 2014-12-02 DIAGNOSIS — Z1389 Encounter for screening for other disorder: Secondary | ICD-10-CM | POA: Diagnosis not present

## 2014-12-16 DIAGNOSIS — R3916 Straining to void: Secondary | ICD-10-CM | POA: Diagnosis not present

## 2014-12-16 DIAGNOSIS — N3941 Urge incontinence: Secondary | ICD-10-CM | POA: Diagnosis not present

## 2014-12-16 DIAGNOSIS — N401 Enlarged prostate with lower urinary tract symptoms: Secondary | ICD-10-CM | POA: Diagnosis not present

## 2015-02-22 DIAGNOSIS — L820 Inflamed seborrheic keratosis: Secondary | ICD-10-CM | POA: Diagnosis not present

## 2015-02-22 DIAGNOSIS — L821 Other seborrheic keratosis: Secondary | ICD-10-CM | POA: Diagnosis not present

## 2015-02-22 DIAGNOSIS — L570 Actinic keratosis: Secondary | ICD-10-CM | POA: Diagnosis not present

## 2015-02-22 DIAGNOSIS — L218 Other seborrheic dermatitis: Secondary | ICD-10-CM | POA: Diagnosis not present

## 2015-02-22 DIAGNOSIS — D1801 Hemangioma of skin and subcutaneous tissue: Secondary | ICD-10-CM | POA: Diagnosis not present

## 2015-04-05 ENCOUNTER — Other Ambulatory Visit: Payer: Self-pay

## 2015-06-07 DIAGNOSIS — I10 Essential (primary) hypertension: Secondary | ICD-10-CM | POA: Diagnosis not present

## 2015-06-07 DIAGNOSIS — Z23 Encounter for immunization: Secondary | ICD-10-CM | POA: Diagnosis not present

## 2015-06-08 ENCOUNTER — Ambulatory Visit (INDEPENDENT_AMBULATORY_CARE_PROVIDER_SITE_OTHER): Payer: Medicare Other | Admitting: Podiatry

## 2015-06-08 ENCOUNTER — Encounter: Payer: Self-pay | Admitting: Podiatry

## 2015-06-08 VITALS — BP 147/93 | HR 90

## 2015-06-08 DIAGNOSIS — L84 Corns and callosities: Secondary | ICD-10-CM | POA: Diagnosis not present

## 2015-06-08 DIAGNOSIS — M79606 Pain in leg, unspecified: Secondary | ICD-10-CM | POA: Diagnosis not present

## 2015-06-08 DIAGNOSIS — B351 Tinea unguium: Secondary | ICD-10-CM | POA: Diagnosis not present

## 2015-06-08 NOTE — Progress Notes (Signed)
   Subjective:    Patient ID: Luis Shaffer, male    DOB: Nov 07, 1945, 69 y.o.   MRN: [id number]  HPI Pt presents with bilateral callus on medial side of big toes   Review of Systems  All other systems reviewed and are negative.      Objective:   Physical Exam        Assessment & Plan:

## 2015-06-09 NOTE — Progress Notes (Signed)
Subjective:     Patient ID: Luis Shaffer, male   DOB: 07/17/1946, 69 y.o.   MRN: [id number]  HPI patient states I have nails on both my feet that bother me and lesions on the inside of the big toes bilateral that become sore. I've tried to trim them and tried trimming nails but I cannot   Review of Systems     Objective:   Physical Exam Neurovascular status unchanged with thick yellow brittle nailbeds 1-5 both feet with the first second and third nails of both feet being tender in the corner and keratotic lesion of the hallux bilateral medial side with pain right over left    Assessment:     Mycotic nail infection symptomatic bilateral and keratotic lesion bilateral    Plan:     Debrided nailbeds on both feet taking out the corners and lesions on the big toe both feet that patient tolerated well and reappoint 3 months for recheck

## 2015-06-28 DIAGNOSIS — H11153 Pinguecula, bilateral: Secondary | ICD-10-CM | POA: Diagnosis not present

## 2015-06-28 DIAGNOSIS — H25013 Cortical age-related cataract, bilateral: Secondary | ICD-10-CM | POA: Diagnosis not present

## 2015-06-28 DIAGNOSIS — H01003 Unspecified blepharitis right eye, unspecified eyelid: Secondary | ICD-10-CM | POA: Diagnosis not present

## 2015-06-28 DIAGNOSIS — H3531 Nonexudative age-related macular degeneration: Secondary | ICD-10-CM | POA: Diagnosis not present

## 2015-06-30 DIAGNOSIS — H5021 Vertical strabismus, right eye: Secondary | ICD-10-CM | POA: Diagnosis not present

## 2015-06-30 DIAGNOSIS — H5213 Myopia, bilateral: Secondary | ICD-10-CM | POA: Diagnosis not present

## 2015-06-30 DIAGNOSIS — H5034 Intermittent alternating exotropia: Secondary | ICD-10-CM | POA: Diagnosis not present

## 2015-07-09 DIAGNOSIS — R3916 Straining to void: Secondary | ICD-10-CM | POA: Diagnosis not present

## 2015-07-09 DIAGNOSIS — N401 Enlarged prostate with lower urinary tract symptoms: Secondary | ICD-10-CM | POA: Diagnosis not present

## 2015-07-09 DIAGNOSIS — N3941 Urge incontinence: Secondary | ICD-10-CM | POA: Diagnosis not present

## 2015-07-19 DIAGNOSIS — M546 Pain in thoracic spine: Secondary | ICD-10-CM | POA: Diagnosis not present

## 2015-08-02 ENCOUNTER — Telehealth: Payer: Self-pay | Admitting: Neurology

## 2015-08-02 DIAGNOSIS — G35 Multiple sclerosis: Secondary | ICD-10-CM | POA: Diagnosis not present

## 2015-08-02 DIAGNOSIS — R410 Disorientation, unspecified: Secondary | ICD-10-CM | POA: Diagnosis not present

## 2015-08-02 DIAGNOSIS — F329 Major depressive disorder, single episode, unspecified: Secondary | ICD-10-CM | POA: Diagnosis not present

## 2015-08-02 NOTE — Telephone Encounter (Signed)
[facility] Coup, NP (Dr. Felipa Eth) [phone] called to request appointment with Dr. Krista Blue due to deterioration in function/MS. Scheduled 1st available with Dr. Krista Blue 08/25/15 2:30pm. They would like to get patient in sooner.

## 2015-08-03 NOTE — Telephone Encounter (Signed)
Spoke to Luis Shaffer - patient's appointment has been moved to an earlier date.  She is going to call and inform him of the new appointment time.

## 2015-08-09 ENCOUNTER — Ambulatory Visit (INDEPENDENT_AMBULATORY_CARE_PROVIDER_SITE_OTHER): Payer: Medicare Other | Admitting: Neurology

## 2015-08-09 ENCOUNTER — Encounter: Payer: Self-pay | Admitting: Neurology

## 2015-08-09 VITALS — BP 131/79 | HR 95 | Ht 68.0 in | Wt 171.0 lb

## 2015-08-09 DIAGNOSIS — G20 Parkinson's disease: Secondary | ICD-10-CM

## 2015-08-09 DIAGNOSIS — G35 Multiple sclerosis: Secondary | ICD-10-CM

## 2015-08-09 DIAGNOSIS — R269 Unspecified abnormalities of gait and mobility: Secondary | ICD-10-CM

## 2015-08-09 MED ORDER — CARBIDOPA-LEVODOPA 25-100 MG PO TABS: 1.0000 | ORAL_TABLET | Freq: Three times a day (TID) | ORAL | Status: DC

## 2015-08-09 MED ORDER — CLONAZEPAM 1 MG PO TABS
1.0000 mg | ORAL_TABLET | Freq: Every day | ORAL | Status: DC
Start: 2015-08-09 — End: 2016-12-18

## 2015-08-09 NOTE — Progress Notes (Signed)
PATIENT: Luis Shaffer DOB: [birth year]  Chief Complaint  Patient presents with  . Multiple Sclerosis    He is here with brother, Luis Shaffer.  Luis Shaffer lost his wife two weeks ago.  His walking has been declining over two years. He has had several falls and at one point, states he had to crawl around his house.  He wakes up multiple times during the night and has to urinate frequently.Marland Kitchen  He has experienced weight loss.  He has had family staying with him since his wife's death.     HISTORICAL  Luis Shaffer is a 69 years old right-handed male, accompanied by his brother Luis Shaffer at today's clinical visit  I saw him previously for relapsing remediating multiple sclerosis last visit was in 2014,his primary care physician is Dr. Felipa Shaffer  He had a past medical history of hypertension, the diagnosis of multiple sclerosis was made in 1998 by neurologist Dr. Candis Shaffer, practicing at [facility], he presented with sudden onset right facial hand and foot numbness, lasting for one month, MRI of the brain in February 1998 showed 3 lesions, one in the right middle cerebellum peduncle, one in the right frontal subcortical white matter, and enhancing lesion in the left pon, further workup including lumbar puncture was positive for oligoclonal band, based on the findings, he was diagnosed with definite multiple sclerosis, however he seems to have fairly benign course, they elected not to treat him with disease modifying medication at that time. Then he lost followup on to September of 2008, he was reading in bed one night, turn off the light, realized that he has lost vision in his left eye, only lasted less than 2 minutes, his vision went back to normal next morning, he was evaluated by Dr. Ron Shaffer in November 2008, repeat MRI of the brain showed mild increased lesion load, but he was still highly functional, no visual difficulty, there was no other clinical flareup, visual evoked  potential was normal, ultrasound of carotid artery was normal  After discussion, He worries about the potential side effect of disease modifying medication, he elected not to receive treatment.  He has been doing very well over the past 6 years, no recurrent neurological symptoms, retired in 2012, still highly function at home, driving, exercise regularly without difficulty. He denied dysarthria, dysphagia or excessive fatigue  UPDATE Aug 09 2015: He has lost follow-up over the past 2 years, brother Luis Shaffer reported that he was noted to have gradual onset gait difficulty since December 2015, gravida difficulty getting worse, more noticeable since his wife passed away few months ago,when his brother became more involving his care, he was noted to not be able to attend his personal needs, confused at evening time, dehydrated, difficulty walking, now he is under multiple family members care, he is able to eat regularly, he has become much stronger, Ambulate without assistance, but he remain occasionally confused, forgetful, especially at evening time,mild gait difficulty, mild constipation,Zoloft was stopped.it has caused him to have increased confusion.  I have reviewed MRI brain with without contrast was patient in September 2014: scattered periventricular subcortical cerebellar white matter lesions compatible with atherosclerosis. No enhancing lesions are noted. Compared with previous MRI scan from 07/08/2007 to the lesions appear unchanged except the left parietal periventricular lesion appears to be new. The pituitary gland appears to be slightly enlarged compared with the previous MRI as well.   REVIEW OF SYSTEMS: Full 14 system review of systems performed and notable only for  ringing ears, frequent awakening, walking difficulty  ALLERGIES: No Known Allergies  HOME MEDICATIONS: Current Outpatient Prescriptions  Medication Sig Dispense Refill  . lisinopril (PRINIVIL,ZESTRIL) 20 MG tablet Take 10  mg by mouth daily.     . traMADol (ULTRAM) 50 MG tablet every 6 (six) hours as needed.  0  . VESICARE 5 MG tablet daily.  10   No current facility-administered medications for this visit.    PAST MEDICAL HISTORY: Past Medical History  Diagnosis Date  . Multiple sclerosis (Luis Shaffer)   . Paresthesia   . High blood pressure   . Hernia     PAST SURGICAL HISTORY: Past Surgical History  Procedure Laterality Date  . Gallbladder surgery  2003    FAMILY HISTORY: Family History  Problem Relation Age of Onset  . Breast cancer Mother   . Bone cancer Mother   . Kidney failure Father     SOCIAL HISTORY:  Social History   Social History  . Marital Status: Married    Spouse Name: Luis Shaffer  . Number of Children: 0  . Years of Education: college   Occupational History  .      Retired   Social History Main Topics  . Smoking status: Former Smoker    Types: Cigarettes  . Smokeless tobacco: Never Used     Comment: Quit 40 years ago  . Alcohol Use: 0.6 oz/week    1 Shots of liquor per week     Comment: one drink daily.  . Drug Use: No  . Sexual Activity: Not on file   Other Topics Concern  . Not on file   Social History Narrative   Patient is retired. College education.   Patient's wife passed away in 14-Jul-2015.   Right handed.   Caffeine- one cup of coffee daily.     PHYSICAL EXAM   Filed Vitals:   08/09/15 1031  BP: 131/79  Pulse: 95  Height: 5\' 8"  (1.727 m)  Weight: 171 lb (77.565 kg)    Not recorded      Body mass index is 26.01 kg/(m^2).  PHYSICAL EXAMNIATION:  Gen: NAD, conversant, well nourised, obese, well groomed                     Cardiovascular: Regular rate rhythm, no peripheral edema, warm, nontender. Eyes: Conjunctivae clear without exudates or hemorrhage Neck: Supple, no carotid bruise. Pulmonary: Clear to auscultation bilaterally   NEUROLOGICAL EXAM:  MENTAL STATUS: Speech: Decreased spontaneous speech, rely on his brother to answer  questions. Cognition:     Orientation to time, place and person     Normal recent and remote memory     Normal Attention span and concentration     Normal Language, naming, repeating,spontaneous speech     Fund of knowledge   CRANIAL NERVES: CN II: Visual fields are full to confrontation. Fundoscopic exam is normal with sharp discs and no vascular changes. Pupils are round equal and briskly reactive to light. CN III, IV, VI: extraocular movement are normal. No ptosis. CN V: Facial sensation is intact to pinprick in all 3 divisions bilaterally. Corneal responses are intact.  CN VII: Face is symmetric with normal eye closure and smile. CN VIII: Hearing is normal to rubbing fingers CN IX, X: Palate elevates symmetrically. Phonation is normal. CN XI: Head turning and shoulder shrug are intact CN XII: Tongue is midline with normal movements and no atrophy.  MOTOR: He has mild left hand resting tremor, mild limb  and nuchal muscle rigidity, he has no significant weakness  REFLEXES: Reflexes are 2+ and symmetric at the biceps, triceps, knees, and ankles. Plantar responses are flexor.  SENSORY: Intact to light touch, pinprick, position sense, and vibration sense are intact in fingers and toes.  COORDINATION: Rapid alternating movements and fine finger movements are intact. There is no dysmetria on finger-to-nose and heel-knee-shin.    GAIT/STANCE: Need to push up to get up from seated position, decreased bilateral arm swing, stoop forward posturing, small stride, mild enblock turning, moderate retropulsed instability   DIAGNOSTIC DATA (LABS, IMAGING, TESTING) - I reviewed patient records, labs, notes, testing and imaging myself where available.   ASSESSMENT AND PLAN  Mahonri Seiden is a 69 y.o. male  With past medical history of hypertension Relapsing Remitting multiple sclerosis since 1998  Has never been treated with long-term immunomodulation therapy Rapid decline gait  difficulty,With parkinsonian features  Started Sinemet 25/100 mg 3 times a day  Repeat MRI of the brain,cervical spine  Physical therapy    Marcial Pacas, M.D. Ph.D.  [facility] [address], [place], [place] [address] Ph: [phone] Fax: [phone]  CC: Referring Provider

## 2015-08-11 ENCOUNTER — Emergency Department (HOSPITAL_COMMUNITY)
Admission: EM | Admit: 2015-08-11 | Discharge: 2015-08-11 | Disposition: A | Discharge status: Home / Self Care | Payer: Medicare Other | Attending: Emergency Medicine | Admitting: Emergency Medicine

## 2015-08-11 ENCOUNTER — Telehealth: Payer: Self-pay | Admitting: Neurology

## 2015-08-11 ENCOUNTER — Emergency Department (HOSPITAL_COMMUNITY): Payer: Medicare Other

## 2015-08-11 DIAGNOSIS — Z87891 Personal history of nicotine dependence: Secondary | ICD-10-CM | POA: Diagnosis not present

## 2015-08-11 DIAGNOSIS — R531 Weakness: Secondary | ICD-10-CM | POA: Diagnosis not present

## 2015-08-11 DIAGNOSIS — Z8669 Personal history of other diseases of the nervous system and sense organs: Secondary | ICD-10-CM | POA: Diagnosis not present

## 2015-08-11 DIAGNOSIS — Z79899 Other long term (current) drug therapy: Secondary | ICD-10-CM | POA: Insufficient documentation

## 2015-08-11 DIAGNOSIS — L539 Erythematous condition, unspecified: Secondary | ICD-10-CM | POA: Diagnosis not present

## 2015-08-11 DIAGNOSIS — I10 Essential (primary) hypertension: Secondary | ICD-10-CM | POA: Insufficient documentation

## 2015-08-11 DIAGNOSIS — R4182 Altered mental status, unspecified: Secondary | ICD-10-CM | POA: Diagnosis not present

## 2015-08-11 DIAGNOSIS — Z8719 Personal history of other diseases of the digestive system: Secondary | ICD-10-CM | POA: Diagnosis not present

## 2015-08-11 DIAGNOSIS — R410 Disorientation, unspecified: Secondary | ICD-10-CM | POA: Diagnosis not present

## 2015-08-11 DIAGNOSIS — M6281 Muscle weakness (generalized): Secondary | ICD-10-CM | POA: Diagnosis not present

## 2015-08-11 DIAGNOSIS — M549 Dorsalgia, unspecified: Secondary | ICD-10-CM | POA: Insufficient documentation

## 2015-08-11 DIAGNOSIS — R262 Difficulty in walking, not elsewhere classified: Secondary | ICD-10-CM | POA: Diagnosis not present

## 2015-08-11 LAB — CBC
HCT: 42.5 % (ref 39.0–52.0)
HEMOGLOBIN: 14.4 g/dL (ref 13.0–17.0)
MCH: 32.9 pg (ref 26.0–34.0)
MCHC: 33.9 g/dL (ref 30.0–36.0)
MCV: 97 fL (ref 78.0–100.0)
Platelets: 324 10*3/uL (ref 150–400)
RBC: 4.38 MIL/uL (ref 4.22–5.81)
RDW: 13.2 % (ref 11.5–15.5)
WBC: 10.2 10*3/uL (ref 4.0–10.5)

## 2015-08-11 LAB — URINALYSIS, ROUTINE W REFLEX MICROSCOPIC
Bilirubin Urine: NEGATIVE
GLUCOSE, UA: NEGATIVE mg/dL
KETONES UR: 15 mg/dL — AB
LEUKOCYTES UA: NEGATIVE
NITRITE: NEGATIVE
PROTEIN: NEGATIVE mg/dL
Specific Gravity, Urine: 1.031 — ABNORMAL HIGH (ref 1.005–1.030)
UROBILINOGEN UA: 0.2 mg/dL (ref 0.0–1.0)
pH: 5 (ref 5.0–8.0)

## 2015-08-11 LAB — COMPREHENSIVE METABOLIC PANEL
ALBUMIN: 3.8 g/dL (ref 3.5–5.0)
ALT: 10 U/L — AB (ref 17–63)
AST: 40 U/L (ref 15–41)
Alkaline Phosphatase: 57 U/L (ref 38–126)
Anion gap: 8 (ref 5–15)
BUN: 18 mg/dL (ref 6–20)
CHLORIDE: 106 mmol/L (ref 101–111)
CO2: 25 mmol/L (ref 22–32)
CREATININE: 1.02 mg/dL (ref 0.61–1.24)
Calcium: 9.3 mg/dL (ref 8.9–10.3)
GFR calc non Af Amer: >60 mL/min (ref >60)
Glucose, Bld: 98 mg/dL (ref 65–99)
Potassium: 4.2 mmol/L (ref 3.5–5.1)
SODIUM: 139 mmol/L (ref 135–145)
Total Bilirubin: 1.7 mg/dL — ABNORMAL HIGH (ref 0.3–1.2)
Total Protein: 7 g/dL (ref 6.5–8.1)

## 2015-08-11 LAB — URINE MICROSCOPIC-ADD ON

## 2015-08-11 LAB — I-STAT CG4 LACTIC ACID, ED
LACTIC ACID, VENOUS: 0.88 mmol/L (ref 0.5–2.0)
LACTIC ACID, VENOUS: 0.49 mmol/L — AB (ref 0.5–2.0)

## 2015-08-11 LAB — CBG MONITORING, ED: Glucose-Capillary: 94 mg/dL (ref 65–99)

## 2015-08-11 MED ORDER — SODIUM CHLORIDE 0.9 % IV SOLN
INTRAVENOUS | Status: DC
  Administered 2015-08-11: 20:00:00 via INTRAVENOUS

## 2015-08-11 MED ORDER — SODIUM CHLORIDE 0.9 % IV BOLUS (SEPSIS)
250.0000 mL | Freq: Once | INTRAVENOUS | Status: AC
  Administered 2015-08-11: 250 mL via INTRAVENOUS

## 2015-08-11 NOTE — ED Notes (Signed)
Pt/caregiver states that he was dx with 'bacteria in his urine' last week but was not given antibiotics. Pt is now having confusion and is having trouble with mobility at home. Usually walks with a walker. Alert.

## 2015-08-11 NOTE — ED Notes (Addendum)
Nephew stated "have noticed a change in him for the past 3 days.  He was dx'd with MS x 19 yrs ago.  He has had more weakness.  He saw his neurologist last week."  Pt placed back on monitor upon return from CT/X-ray.  Pt states he uses a rolling walker to ambulate.

## 2015-08-11 NOTE — Discharge Instructions (Signed)
Recommend follow-up with his neurologist by telephone tomorrow. The starting of the carbidopa may be having some effect on the confusion. Return for any new or worse symptoms. Workup here today without any evidence of pneumonia urinary tract infection electrolyte abnormalities. Head CT was negative.

## 2015-08-11 NOTE — ED Notes (Signed)
Patient transported to CT 

## 2015-08-11 NOTE — Telephone Encounter (Signed)
Spoke to Dr. Arlan Organ (RN at [facility]) and [name] (Brother on [facility]).  Luis Shaffer reported an acute, change in patient's mental status - patient was found to have bacteria in his urine already last week.  Denies constipation.  He needs a repeat UA.  He will go back to his PCP or ED for evaluation of his worsening mental state.  Spoke to [name] and brother, [name], that are both in agreement to this plan.  His MRI scans are scheduled for 10/19/14 and they will keep this appointment.

## 2015-08-11 NOTE — ED Notes (Signed)
Pt dressed by RN & placed in w/c.  CNA assisted to BR.

## 2015-08-11 NOTE — Telephone Encounter (Signed)
Brother [name] called to advise patient is having moments where is not as lucid. States patient is now getting 24/7 care and today patient fell, [facility] nurse Lucien Mons Cell# [phone] evaluated him and also witnessed patient's less than lucid moments. Brother also states that no one has ever called to schedule MRI.

## 2015-08-11 NOTE — ED Provider Notes (Signed)
CSN: [id number]     Arrival date & time 08/11/15  1836 History   First MD Initiated Contact with Patient 08/11/15 1904     Chief Complaint  Patient presents with  . Altered Mental Status     (Consider location/radiation/quality/duration/timing/severity/associated sxs/prior Treatment) Patient is a 69 y.o. male presenting with altered mental status. The history is provided by the patient.  Altered Mental Status Presenting symptoms: confusion   Associated symptoms: weakness   Associated symptoms: no abdominal pain, no fever, no headaches and no rash    patient brought in by family members. For altered mental status. Patient started with increased confusion from baseline about 2 days ago. Patient started on new medication carbidopa 2 days ago. Patient also 1 week ago had bacteria in his urine but urine culture didn't grow anything consistent with urinary tract infection. Patient usually walks with a walker. Patient is having more trouble getting around home due to his gait being somewhat off and is having increased confusion from his baseline. Past medical history is significant for multiple sclerosis. Patient complaining of some back pain but that is a problem is being followed by his primary care doctor. Family reports no evidence nausea vomiting no no fevers no upper respiratory infections no cough.  Past Medical History  Diagnosis Date  . Multiple sclerosis ([place])   . Paresthesia   . High blood pressure   . Hernia    Past Surgical History  Procedure Laterality Date  . Gallbladder surgery  2003   Family History  Problem Relation Age of Onset  . Breast cancer Mother   . Bone cancer Mother   . Kidney failure Father    Social History  Substance Use Topics  . Smoking status: Former Smoker    Types: Cigarettes  . Smokeless tobacco: Never Used     Comment: Quit 40 years ago  . Alcohol Use: 0.6 oz/week    1 Shots of liquor per week     Comment: one drink daily.    Review of  Systems  Constitutional: Positive for fatigue. Negative for fever.  HENT: Negative for congestion.   Eyes: Negative for redness.  Respiratory: Negative for shortness of breath.   Cardiovascular: Negative for chest pain.  Gastrointestinal: Negative for abdominal pain.  Genitourinary: Negative for dysuria.  Musculoskeletal: Positive for back pain. Negative for neck pain.  Skin: Negative for rash.  Neurological: Positive for weakness. Negative for headaches.  Hematological: Does not bruise/bleed easily.  Psychiatric/Behavioral: Positive for confusion.      Allergies  Review of patient's allergies indicates no known allergies.  Home Medications   Prior to Admission medications   Medication Sig Start Date End Date Taking? Authorizing Provider  carbidopa-levodopa (SINEMET IR) 25-100 MG tablet Take 1 tablet by mouth 3 (three) times daily. 08/09/15  Yes Marcial Pacas, MD  clonazePAM (KLONOPIN) 1 MG tablet Take 1 tablet (1 mg total) by mouth at bedtime. 08/09/15  Yes Marcial Pacas, MD  lisinopril (PRINIVIL,ZESTRIL) 20 MG tablet Take 10 mg by mouth daily.  04/29/13  Yes Historical Provider, MD  sertraline (ZOLOFT) 25 MG tablet Take 25 mg by mouth daily.   Yes Historical Provider, MD  traMADol (ULTRAM) 50 MG tablet Take 50 mg by mouth every 6 (six) hours as needed for moderate pain or severe pain.  07/22/15  Yes Historical Provider, MD  VESICARE 5 MG tablet Take 5 mg by mouth at bedtime.  07/30/15  Yes Historical Provider, MD   BP 102/72 mmHg  Pulse  99  Temp(Src) 97.8 F (36.6 C) (Oral)  Resp 16  SpO2 98% Physical Exam  Constitutional: He is oriented to person, place, and time. He appears well-developed and well-nourished. No distress.  HENT:  Head: Normocephalic and atraumatic.  Mouth/Throat: Oropharynx is clear and moist.  Eyes: Conjunctivae and EOM are normal. Pupils are equal, round, and reactive to light.  Neck: Normal range of motion. Neck supple.  Cardiovascular: Normal rate, regular  rhythm and normal heart sounds.   No murmur heard. Pulmonary/Chest: Effort normal and breath sounds normal. No respiratory distress.  Abdominal: Soft. Bowel sounds are normal. There is no tenderness.  Musculoskeletal: Normal range of motion.  Neurological: He is alert and oriented to person, place, and time. No cranial nerve deficit. He exhibits normal muscle tone. Coordination normal.  Skin: Skin is warm. There is erythema.  Nursing note and vitals reviewed.   ED Course  Procedures (including critical care time) Labs Review Labs Reviewed  COMPREHENSIVE METABOLIC PANEL - Abnormal; Notable for the following:    ALT 10 (*)    Total Bilirubin 1.7 (*)    All other components within normal limits  URINALYSIS, ROUTINE W REFLEX MICROSCOPIC (NOT AT [facility]) - Abnormal; Notable for the following:    Color, Urine AMBER (*)    Specific Gravity, Urine 1.031 (*)    Hgb urine dipstick TRACE (*)    Ketones, ur 15 (*)    All other components within normal limits  URINE MICROSCOPIC-ADD ON - Abnormal; Notable for the following:    Crystals CA OXALATE CRYSTALS (*)    All other components within normal limits  CBC  CBG MONITORING, ED  I-STAT CG4 LACTIC ACID, ED  I-STAT CG4 LACTIC ACID, ED   Results for orders placed or performed during the hospital encounter of 08/11/15  Comprehensive metabolic panel  Result Value Ref Range   Sodium 139 135 - 145 mmol/L   Potassium 4.2 3.5 - 5.1 mmol/L   Chloride 106 101 - 111 mmol/L   CO2 25 22 - 32 mmol/L   Glucose, Bld 98 65 - 99 mg/dL   BUN 18 6 - 20 mg/dL   Creatinine, Ser 1.02 0.61 - 1.24 mg/dL   Calcium 9.3 8.9 - 10.3 mg/dL   Total Protein 7.0 6.5 - 8.1 g/dL   Albumin 3.8 3.5 - 5.0 g/dL   AST 40 15 - 41 U/L   ALT 10 (L) 17 - 63 U/L   Alkaline Phosphatase 57 38 - 126 U/L   Total Bilirubin 1.7 (H) 0.3 - 1.2 mg/dL   GFR calc non Af Amer >60 >60 mL/min   GFR calc Af Amer >60 >60 mL/min   Anion gap 8 5 - 15  CBC  Result Value Ref Range   WBC 10.2 4.0  - 10.5 K/uL   RBC 4.38 4.22 - 5.81 MIL/uL   Hemoglobin 14.4 13.0 - 17.0 g/dL   HCT 42.5 39.0 - 52.0 %   MCV 97.0 78.0 - 100.0 fL   MCH 32.9 26.0 - 34.0 pg   MCHC 33.9 30.0 - 36.0 g/dL   RDW 13.2 11.5 - 15.5 %   Platelets 324 150 - 400 K/uL  Urinalysis, Routine w reflex microscopic (not at [facility])  Result Value Ref Range   Color, Urine AMBER (A) YELLOW   APPearance CLEAR CLEAR   Specific Gravity, Urine 1.031 (H) 1.005 - 1.030   pH 5.0 5.0 - 8.0   Glucose, UA NEGATIVE NEGATIVE mg/dL   Hgb urine dipstick TRACE (  A) NEGATIVE   Bilirubin Urine NEGATIVE NEGATIVE   Ketones, ur 15 (A) NEGATIVE mg/dL   Protein, ur NEGATIVE NEGATIVE mg/dL   Urobilinogen, UA 0.2 0.0 - 1.0 mg/dL   Nitrite NEGATIVE NEGATIVE   Leukocytes, UA NEGATIVE NEGATIVE  Urine microscopic-add on  Result Value Ref Range   WBC, UA 0-2 <3 WBC/hpf   Crystals CA OXALATE CRYSTALS (A) NEGATIVE  CBG monitoring, ED  Result Value Ref Range   Glucose-Capillary 94 65 - 99 mg/dL  I-Stat CG4 Lactic Acid, ED  Result Value Ref Range   Lactic Acid, Venous 0.88 0.5 - 2.0 mmol/L     Imaging Review Dg Chest 2 View  08/11/2015  CLINICAL DATA:  Mental status changes. EXAM: CHEST  2 VIEW COMPARISON:  12/29/2013 FINDINGS: Mild cardiomegaly. Normal pulmonary vascularity. Lungs are clear. No pneumothorax or pleural effusion. Thoracic spine is intact. IMPRESSION: Cardiomegaly without decompensation. Electronically Signed   By: Marybelle Killings M.D.   On: 08/11/2015 19:49   Ct Head Wo Contrast  08/11/2015  CLINICAL DATA:  69 year old male with confusion and difficulty with mobility. Urinary tract infection. EXAM: CT HEAD WITHOUT CONTRAST TECHNIQUE: Contiguous axial images were obtained from the base of the skull through the vertex without intravenous contrast. COMPARISON:  MRI of the brain 07/07/2013. FINDINGS: Patchy and confluent areas of decreased attenuation are noted throughout the deep and periventricular white matter of the cerebral hemispheres  bilaterally, compatible with chronic microvascular ischemic disease. Physiologic calcifications of the left basal ganglia. No acute intracranial abnormalities. Specifically, no evidence of acute intracranial hemorrhage, no definite findings of acute/subacute cerebral ischemia, no mass, mass effect, hydrocephalus or abnormal intra or extra-axial fluid collections. Visualized paranasal sinuses and mastoids are well pneumatized, with exception of some mild multifocal mucosal thickening throughout the ethmoid sinuses bilaterally. No acute displaced skull fractures are identified. IMPRESSION: 1. No acute intracranial abnormalities. 2. Mild chronic microvascular ischemic changes in the cerebral white matter, as above. Electronically Signed   By: Vinnie Langton M.D.   On: 08/11/2015 19:39   I have personally reviewed and evaluated these images and lab results as part of my medical decision-making.   EKG Interpretation   Date/Time:  Wednesday August 11 2015 18:49:00 EDT Ventricular Rate:  103 PR Interval:  156 QRS Duration: 100 QT Interval:  355 QTC Calculation: 465 R Axis:   103 Text Interpretation:  Sinus tachycardia Probable right ventricular  hypertrophy Baseline wander in lead(s) V2 Confirmed by Chundra Sauerwein  MD,  Sherita Decoste ([id number]) on 08/11/2015 7:07:40 PM      MDM   Final diagnoses:  Confusion    Workup for the altered mental status and confusion without any significant findings. No evidence of pneumonia no evidence urinary tract infection head CT is normal. No significant electrolyte abnormalities. Patient recently started on carbidopa 2 days ago this may be playing a role because that's when things started to get worse.  Patient is stable for discharge home. No fever no clinical finding of infection. Lactic acid was normal.  We'll have family members contact his neurologist tomorrow for discussion of out the new medication possibly playing a role. They will return for any new or worse  symptoms.    Fredia Sorrow, MD 08/11/15 2238

## 2015-08-12 NOTE — Telephone Encounter (Signed)
Dr. Krista Blue review patient's ED records.  He is still having confusion.  She would like him to stop his Sinemet, keep his MRI appts and come in for an earlier follow up.  I have spoken to his brother, [name], who is agreeable to this plan.

## 2015-08-20 ENCOUNTER — Ambulatory Visit
Admission: RE | Admit: 2015-08-20 | Discharge: 2015-08-20 | Disposition: A | Discharge status: Home / Self Care | Payer: Medicare Other | Source: Ambulatory Visit | Attending: Neurology | Admitting: Neurology

## 2015-08-20 DIAGNOSIS — G35 Multiple sclerosis: Secondary | ICD-10-CM

## 2015-08-20 DIAGNOSIS — R269 Unspecified abnormalities of gait and mobility: Secondary | ICD-10-CM

## 2015-08-20 DIAGNOSIS — G20 Parkinson's disease: Secondary | ICD-10-CM

## 2015-08-20 DIAGNOSIS — G20C Parkinsonism, unspecified: Secondary | ICD-10-CM

## 2015-08-20 DIAGNOSIS — G35D Multiple sclerosis, unspecified: Secondary | ICD-10-CM

## 2015-08-20 MED ORDER — GADOBENATE DIMEGLUMINE 529 MG/ML IV SOLN
15.0000 mL | Freq: Once | INTRAVENOUS | Status: AC | PRN
  Administered 2015-08-20: 15 mL via INTRAVENOUS

## 2015-08-23 ENCOUNTER — Telehealth: Payer: Self-pay | Admitting: Neurology

## 2015-08-23 NOTE — Telephone Encounter (Signed)
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  Will review findings at his next follow-up visit in August 24 2015  IMPRESSION: This MRI of the cervical spine with and without contrast shows the following: 1. Multilevel degenerative changes as detailed above. Although there is no definite nerve root compression, there is moderate foraminal narrowing to the right at C3-C4, bilaterally at C4-C5 and to the right at C5-C6. This leads to some encroachment upon the right C4, bilateral C5 and right C6 nerve roots without definite compression. 2. There is heterogenous bone marrow signal and the inferior C4 endplate is depressed. These changes could be due to osteoporosis. There are no enhancing lesions within the vertebral bodies. 3. The spinal cord appears normal. There are no acute findings.  This is an abnormal MRI of the brain with and without contrast showing the following: 1. T2/FLAIR hyperintense foci in the cerebral peduncles, brainstem and cerebral hemispheres in a pattern and configuration consistent with the diagnosis of multiple sclerosis. None of the foci appears to be acute. When compared to an MRI dated 06/29/2013, there is no definite interval change. 2. The pituitary gland is enlarged at 12 mm. This is also increased when compared to the MRI dated 06/29/2013. Consider further evaluation for pituitary adenomas. 3. Mild chronic inflammatory changes are noted within the maxillary sinuses and one of the ethmoid air cells. The extent is similar to what was seen in 2014.

## 2015-08-24 ENCOUNTER — Ambulatory Visit (INDEPENDENT_AMBULATORY_CARE_PROVIDER_SITE_OTHER): Payer: Medicare Other | Admitting: Neurology

## 2015-08-24 ENCOUNTER — Encounter: Payer: Self-pay | Admitting: Neurology

## 2015-08-24 VITALS — BP 108/73 | HR 97

## 2015-08-24 DIAGNOSIS — G35 Multiple sclerosis: Secondary | ICD-10-CM

## 2015-08-24 DIAGNOSIS — R269 Unspecified abnormalities of gait and mobility: Secondary | ICD-10-CM | POA: Insufficient documentation

## 2015-08-24 DIAGNOSIS — F0280 Dementia in other diseases classified elsewhere without behavioral disturbance: Secondary | ICD-10-CM | POA: Diagnosis not present

## 2015-08-24 DIAGNOSIS — G3183 Dementia with Lewy bodies: Secondary | ICD-10-CM

## 2015-08-24 DIAGNOSIS — R799 Abnormal finding of blood chemistry, unspecified: Secondary | ICD-10-CM | POA: Diagnosis not present

## 2015-08-24 DIAGNOSIS — E079 Disorder of thyroid, unspecified: Secondary | ICD-10-CM | POA: Diagnosis not present

## 2015-08-24 MED ORDER — RIVASTIGMINE 9.5 MG/24HR TD PT24: 9.5000 mg | MEDICATED_PATCH | Freq: Every day | TRANSDERMAL | Status: DC

## 2015-08-24 MED ORDER — RIVASTIGMINE 4.6 MG/24HR TD PT24: 4.6000 mg | MEDICATED_PATCH | Freq: Every day | TRANSDERMAL | Status: DC

## 2015-08-24 NOTE — Patient Instructions (Signed)
I will refer him to [facility] movement specialist Dr. Linus Mako

## 2015-08-24 NOTE — Progress Notes (Signed)
Chief Complaint  Patient presents with  . Multiple Sclerosis    He is here with his brother, [name], to discuss his MRI results.  He is not longer taking Sinemet.  He has had a rapid decline in his motor skills in the last two weeks.  He was unable to start physical therapy.  His appetite has decreased and he is not sleeping well.      PATIENT: Luis Shaffer DOB: [birth year]  Chief Complaint  Patient presents with  . Multiple Sclerosis    He is here with his brother, [name], to discuss his MRI results.  He is not longer taking Sinemet.  He has had a rapid decline in his motor skills in the last two weeks.  He was unable to start physical therapy.  His appetite has decreased and he is not sleeping well.     HISTORICAL  Luis Shaffer is a 69 years old right-handed male, accompanied by his brother [name] at today's clinical visit  I saw him previously for relapsing remitting multiple sclerosis last visit was in 2014,his primary care physician is Dr. Felipa Eth  He had a past medical history of hypertension, the diagnosis of multiple sclerosis was made in 1998 by neurologist Dr. Candis Schatz, practicing at [facility] then, he presented with sudden onset right facial hand and foot numbness, lasting for one month, MRI of the brain in February 1998 showed 3 lesions, one in the right middle cerebellum peduncle, one in the right frontal subcortical white matter, and enhancing lesion in the left pon, further workup including lumbar puncture was positive for oligoclonal band, based on the findings, he was diagnosed with definite multiple sclerosis, however he seems to have fairly benign course, they elected not to treat him with disease modifying medication at that time. Then he lost followup on to September of 2008, he was reading in bed one night, turn off the light, realized that he has lost vision in his left eye, only lasted less than 2 minutes, his vision went back to normal next  morning, he was evaluated by Dr. Ron Parker in November 2008, repeat MRI of the brain showed mild increased lesion load, but he was still highly functional, no visual difficulty, there was no other clinical flareup, visual evoked potential was normal, ultrasound of carotid artery was normal  After discussion, He worries about the potential side effect of disease modifying medication, he elected not to receive treatment.  He has been doing very well over the past 6 years, no recurrent neurological symptoms, retired in 2012, still highly function at home, driving, exercise regularly without difficulty. He denied dysarthria, dysphagia or excessive fatigue  UPDATE Aug 09 2015: He has lost follow-up over the past 2 years, brother [name] reported that he was noted to have gradual onset gait difficulty since December 2015, gravida difficulty getting worse, more noticeable since his wife passed away few months ago,when his brother became more involving his care, he was noted to not be able to attend his personal needs, confused at evening time, dehydrated, difficulty walking, now he is under multiple family members care, he is able to eat regularly, he has become much stronger, Ambulate without assistance, but he remain occasionally confused, forgetful, especially at evening time,mild gait difficulty, mild constipation,Zoloft was stopped.it has caused him to have increased confusion.  I have reviewed MRI brain with without contrast was patient in September 2014: scattered periventricular subcortical cerebellar white matter lesions compatible with atherosclerosis. No enhancing lesions are noted. Compared  with previous MRI scan from 07/08/2007 to the lesions appear unchanged except the left parietal periventricular lesion appears to be new. The pituitary gland appears to be slightly enlarged compared with the previous MRI as well.  UPDATE Aug 24 2015:  He is with his brother [name] at today's clinical visit, since last  visit in 28-Jul-2015, he had acute worsening, few days after he started Sinemet 25/100 mg, he was noted to have increased confusion, loss of appetite, not eating well, he was taken to emergency room, UA showed no evidence of UTI, but showed UA was amber, high gravity, with ketone, suggestive of dehydration, he has home care 24 x7, he has poor sleep, agitations, confusion, visual hallucinations,  Excessive drowsiness during the daytime, decline memory trouble, worsening gait difficulties, he could barely able to walk even with walker now.  We have reviewed MRI films, MRI of the brain showed evidence of atrophy, periventricular MS lesions, no significant change compared to 2014, MRI of the cervical spine showed multilevel degenerative disc disease no significant canal stenosis  REVIEW OF SYSTEMS: Full 14 system review of systems performed and notable only for ringing ears, frequent awakening, walking difficulty  ALLERGIES: No Known Allergies  HOME MEDICATIONS: Current Outpatient Prescriptions  Medication Sig Dispense Refill  . clonazePAM (KLONOPIN) 1 MG tablet Take 1 tablet (1 mg total) by mouth at bedtime. 30 tablet 3  . lisinopril (PRINIVIL,ZESTRIL) 20 MG tablet Take 10 mg by mouth daily.     . traMADol (ULTRAM) 50 MG tablet Take 50 mg by mouth every 6 (six) hours as needed for moderate pain or severe pain.   0  . VESICARE 5 MG tablet Take 5 mg by mouth at bedtime.   10   No current facility-administered medications for this visit.    PAST MEDICAL HISTORY: Past Medical History  Diagnosis Date  . Multiple sclerosis ([place])   . Paresthesia   . High blood pressure   . Hernia     PAST SURGICAL HISTORY: Past Surgical History  Procedure Laterality Date  . Gallbladder surgery  2003    FAMILY HISTORY: Family History  Problem Relation Age of Onset  . Breast cancer Mother   . Bone cancer Mother   . Kidney failure Father     SOCIAL HISTORY:  Social History   Social History  .  Marital Status: Married    Spouse Name: Decesare Sauer  . Number of Children: 0  . Years of Education: college   Occupational History  .      Retired   Social History Main Topics  . Smoking status: Former Smoker    Types: Cigarettes  . Smokeless tobacco: Never Used     Comment: Quit 40 years ago  . Alcohol Use: 0.6 oz/week    1 Shots of liquor per week     Comment: one drink daily.  . Drug Use: No  . Sexual Activity: Not on file   Other Topics Concern  . Not on file   Social History Narrative   Patient is retired. College education.   Patient's wife passed away in 07-28-2015.   Right handed.   Caffeine- one cup of coffee daily.     PHYSICAL EXAM   Filed Vitals:   08/24/15 1512  BP: 108/73  Pulse: 97    Not recorded      There is no weight on file to calculate BMI.  PHYSICAL EXAMNIATION:  Gen: NAD, conversant, well nourised, obese, well groomed  Cardiovascular: Regular rate rhythm, no peripheral edema, warm, nontender. Eyes: Conjunctivae clear without exudates or hemorrhage Neck: Supple, no carotid bruise. Pulmonary: Clear to auscultation bilaterally   NEUROLOGICAL EXAM:  MENTAL STATUS: Speech: Decreased spontaneous speech, rely on his brother to answer questions. Cognition: He depend on his brother to answer questions,     Orientation to time, place and person     Normal recent and remote memory     Normal Attention span and concentration     Normal Language, naming, repeating,spontaneous speech     Fund of knowledge   CRANIAL NERVES: CN II: Visual fields are full to confrontation. Fundoscopic exam is normal with sharp discs and no vascular changes. Pupils are round equal and briskly reactive to light. CN III, IV, VI: extraocular movement are normal. No ptosis. CN V: Facial sensation is intact to pinprick in all 3 divisions bilaterally. Corneal responses are intact.  CN VII: Face is symmetric with normal eye closure and smile. CN  VIII: Hearing is normal to rubbing fingers CN IX, X: Palate elevates symmetrically. Phonation is normal. CN XI: Head turning and shoulder shrug are intact CN XII: Tongue is midline with normal movements and no atrophy.  MOTOR: He has mild left hand resting tremor, mild limb and nuchal muscle rigidity, he has no significant weakness  REFLEXES: Reflexes are 2+ and symmetric at the biceps, triceps, knees, and ankles. Plantar responses are flexor.  SENSORY: Intact to light touch, pinprick, position sense, and vibration sense are intact in fingers and toes.  COORDINATION: Rapid alternating movements and fine finger movements are intact. There is no dysmetria on finger-to-nose and heel-knee-shin.    GAIT/STANCE: He needs 2 people assistant to get up from seated position, leaning backwards, difficulty initiate gait, small shuffling,   DIAGNOSTIC DATA (LABS, IMAGING, TESTING) - I reviewed patient records, labs, notes, testing and imaging myself where available.   ASSESSMENT AND PLAN  Xai Muscato is a 69 y.o. male  with past medical history of hypertension Relapsing Remitting multiple sclerosis since 1998  Has never been treated with long-term immunomodulation therapy  MRI of the brain showed stable supratentorium lesions, no evidence of cervical involvement  Possible Lewy body dementia  With his rapid decline memory loss, gait difficulty, parkinsonian features, poor tolerance to medications, fluctuation,  Could not tolerate Sinemet,  We will try Exelon patch, titrating to 9.6 milligrams daily  Further academic neurologist Dr. Linus Mako  Physical therapy  Laboratory evaluations  Marcial Pacas, M.D. Ph.D.  [facility] [address] [place], [place] [address] Ph: [phone] Fax: [phone]  CC: Referring Provider

## 2015-08-25 ENCOUNTER — Telehealth: Payer: Self-pay | Admitting: *Deleted

## 2015-08-25 ENCOUNTER — Ambulatory Visit: Payer: Medicare Other | Admitting: Neurology

## 2015-08-25 DIAGNOSIS — G3183 Dementia with Lewy bodies: Secondary | ICD-10-CM | POA: Diagnosis not present

## 2015-08-25 DIAGNOSIS — R21 Rash and other nonspecific skin eruption: Secondary | ICD-10-CM | POA: Diagnosis not present

## 2015-08-25 LAB — THYROID PANEL WITH TSH
FREE THYROXINE INDEX: 2.5 (ref 1.2–4.9)
T3 UPTAKE RATIO: 28 % (ref 24–39)
T4 TOTAL: 9.1 ug/dL (ref 4.5–12.0)
TSH: 1.42 u[IU]/mL (ref 0.450–4.500)

## 2015-08-25 LAB — VITAMIN B12: Vitamin B-12: 507 pg/mL (ref 211–946)

## 2015-08-25 NOTE — Telephone Encounter (Signed)
-----   Message from Marcial Pacas, MD sent at 08/25/2015 11:08 AM EST ----- Please call patient for normal laboratory result

## 2015-08-25 NOTE — Addendum Note (Signed)
Addended by: Marcial Pacas on: 08/25/2015 05:10 PM   Modules accepted: Level of Service

## 2015-08-25 NOTE — Telephone Encounter (Signed)
Spoke to his nephew, [name] (POA, on HIPPA) - aware of results.

## 2015-09-09 ENCOUNTER — Ambulatory Visit: Payer: Medicare Other

## 2015-09-14 DIAGNOSIS — R630 Anorexia: Secondary | ICD-10-CM | POA: Diagnosis not present

## 2015-09-14 DIAGNOSIS — G9340 Encephalopathy, unspecified: Secondary | ICD-10-CM | POA: Diagnosis not present

## 2015-09-14 DIAGNOSIS — I10 Essential (primary) hypertension: Secondary | ICD-10-CM | POA: Diagnosis not present

## 2015-09-14 DIAGNOSIS — G3183 Dementia with Lewy bodies: Secondary | ICD-10-CM | POA: Diagnosis not present

## 2015-09-15 DIAGNOSIS — G3183 Dementia with Lewy bodies: Secondary | ICD-10-CM | POA: Diagnosis not present

## 2015-09-15 DIAGNOSIS — G9340 Encephalopathy, unspecified: Secondary | ICD-10-CM | POA: Diagnosis not present

## 2015-09-15 DIAGNOSIS — I10 Essential (primary) hypertension: Secondary | ICD-10-CM | POA: Diagnosis not present

## 2015-09-15 DIAGNOSIS — R630 Anorexia: Secondary | ICD-10-CM | POA: Diagnosis not present

## 2015-09-16 DIAGNOSIS — G3183 Dementia with Lewy bodies: Secondary | ICD-10-CM | POA: Diagnosis not present

## 2015-09-16 DIAGNOSIS — G9340 Encephalopathy, unspecified: Secondary | ICD-10-CM | POA: Diagnosis not present

## 2015-09-16 DIAGNOSIS — R630 Anorexia: Secondary | ICD-10-CM | POA: Diagnosis not present

## 2015-09-16 DIAGNOSIS — I10 Essential (primary) hypertension: Secondary | ICD-10-CM | POA: Diagnosis not present

## 2015-09-17 ENCOUNTER — Telehealth: Payer: Self-pay | Admitting: Neurology

## 2015-09-17 DIAGNOSIS — G3183 Dementia with Lewy bodies: Secondary | ICD-10-CM | POA: Diagnosis not present

## 2015-09-17 DIAGNOSIS — I10 Essential (primary) hypertension: Secondary | ICD-10-CM | POA: Diagnosis not present

## 2015-09-17 DIAGNOSIS — R630 Anorexia: Secondary | ICD-10-CM | POA: Diagnosis not present

## 2015-09-17 DIAGNOSIS — G9340 Encephalopathy, unspecified: Secondary | ICD-10-CM | POA: Diagnosis not present

## 2015-09-17 NOTE — Telephone Encounter (Signed)
Hilbert Bible called to advise when going through mail, he found an approval letter from [employer] for [employer], nephew didn't know anything about this, also wanted to let Dr. Krista Blue know that patient has entered hospice care. Is aware that Dr. Krista Blue is out of the office today and will be back on Monday.

## 2015-09-17 NOTE — Telephone Encounter (Signed)
Please call [phone]

## 2015-09-20 ENCOUNTER — Ambulatory Visit: Payer: Medicare Other | Admitting: Neurology

## 2015-09-20 DIAGNOSIS — G3183 Dementia with Lewy bodies: Secondary | ICD-10-CM | POA: Diagnosis not present

## 2015-09-20 DIAGNOSIS — G9340 Encephalopathy, unspecified: Secondary | ICD-10-CM | POA: Diagnosis not present

## 2015-09-20 DIAGNOSIS — I10 Essential (primary) hypertension: Secondary | ICD-10-CM | POA: Diagnosis not present

## 2015-09-20 DIAGNOSIS — R630 Anorexia: Secondary | ICD-10-CM | POA: Diagnosis not present

## 2015-09-20 NOTE — Telephone Encounter (Signed)
Spoke to Luis Shaffer - clarified that Exelon was sent in for his dementia at his 08/24/15 office visit - he is going to check with Hospice to see if he should start the medication.  Since he is now under Hospice care, he requested his appointment here be canceled.

## 2015-09-21 NOTE — Telephone Encounter (Addendum)
Approval received for Exelon patches from [employer] [phone]) - [place] valid through 10/08/16 - Pt [id number] - [id number].

## 2015-09-22 ENCOUNTER — Ambulatory Visit: Payer: Medicare Other | Admitting: Neurology

## 2015-09-22 DIAGNOSIS — G9340 Encephalopathy, unspecified: Secondary | ICD-10-CM | POA: Diagnosis not present

## 2015-09-22 DIAGNOSIS — I10 Essential (primary) hypertension: Secondary | ICD-10-CM | POA: Diagnosis not present

## 2015-09-22 DIAGNOSIS — G3183 Dementia with Lewy bodies: Secondary | ICD-10-CM | POA: Diagnosis not present

## 2015-09-22 DIAGNOSIS — R630 Anorexia: Secondary | ICD-10-CM | POA: Diagnosis not present

## 2015-09-23 ENCOUNTER — Encounter: Payer: Self-pay | Admitting: Cardiology

## 2015-09-24 DIAGNOSIS — G3183 Dementia with Lewy bodies: Secondary | ICD-10-CM | POA: Diagnosis not present

## 2015-09-24 DIAGNOSIS — I10 Essential (primary) hypertension: Secondary | ICD-10-CM | POA: Diagnosis not present

## 2015-09-24 DIAGNOSIS — R630 Anorexia: Secondary | ICD-10-CM | POA: Diagnosis not present

## 2015-09-24 DIAGNOSIS — G9340 Encephalopathy, unspecified: Secondary | ICD-10-CM | POA: Diagnosis not present

## 2015-09-27 DIAGNOSIS — R630 Anorexia: Secondary | ICD-10-CM | POA: Diagnosis not present

## 2015-09-27 DIAGNOSIS — G9340 Encephalopathy, unspecified: Secondary | ICD-10-CM | POA: Diagnosis not present

## 2015-09-27 DIAGNOSIS — G3183 Dementia with Lewy bodies: Secondary | ICD-10-CM | POA: Diagnosis not present

## 2015-09-27 DIAGNOSIS — I10 Essential (primary) hypertension: Secondary | ICD-10-CM | POA: Diagnosis not present

## 2015-09-29 DIAGNOSIS — G3183 Dementia with Lewy bodies: Secondary | ICD-10-CM | POA: Diagnosis not present

## 2015-09-29 DIAGNOSIS — G9340 Encephalopathy, unspecified: Secondary | ICD-10-CM | POA: Diagnosis not present

## 2015-09-29 DIAGNOSIS — R630 Anorexia: Secondary | ICD-10-CM | POA: Diagnosis not present

## 2015-09-29 DIAGNOSIS — I10 Essential (primary) hypertension: Secondary | ICD-10-CM | POA: Diagnosis not present

## 2015-09-30 DIAGNOSIS — G9340 Encephalopathy, unspecified: Secondary | ICD-10-CM | POA: Diagnosis not present

## 2015-09-30 DIAGNOSIS — G3183 Dementia with Lewy bodies: Secondary | ICD-10-CM | POA: Diagnosis not present

## 2015-09-30 DIAGNOSIS — I10 Essential (primary) hypertension: Secondary | ICD-10-CM | POA: Diagnosis not present

## 2015-09-30 DIAGNOSIS — R630 Anorexia: Secondary | ICD-10-CM | POA: Diagnosis not present

## 2015-10-01 DIAGNOSIS — G3183 Dementia with Lewy bodies: Secondary | ICD-10-CM | POA: Diagnosis not present

## 2015-10-01 DIAGNOSIS — G9340 Encephalopathy, unspecified: Secondary | ICD-10-CM | POA: Diagnosis not present

## 2015-10-01 DIAGNOSIS — R630 Anorexia: Secondary | ICD-10-CM | POA: Diagnosis not present

## 2015-10-01 DIAGNOSIS — I10 Essential (primary) hypertension: Secondary | ICD-10-CM | POA: Diagnosis not present

## 2015-10-04 DIAGNOSIS — I10 Essential (primary) hypertension: Secondary | ICD-10-CM | POA: Diagnosis not present

## 2015-10-04 DIAGNOSIS — G3183 Dementia with Lewy bodies: Secondary | ICD-10-CM | POA: Diagnosis not present

## 2015-10-04 DIAGNOSIS — G9340 Encephalopathy, unspecified: Secondary | ICD-10-CM | POA: Diagnosis not present

## 2015-10-04 DIAGNOSIS — R630 Anorexia: Secondary | ICD-10-CM | POA: Diagnosis not present

## 2015-10-06 DIAGNOSIS — R630 Anorexia: Secondary | ICD-10-CM | POA: Diagnosis not present

## 2015-10-06 DIAGNOSIS — G9340 Encephalopathy, unspecified: Secondary | ICD-10-CM | POA: Diagnosis not present

## 2015-10-06 DIAGNOSIS — I10 Essential (primary) hypertension: Secondary | ICD-10-CM | POA: Diagnosis not present

## 2015-10-06 DIAGNOSIS — G3183 Dementia with Lewy bodies: Secondary | ICD-10-CM | POA: Diagnosis not present

## 2015-10-07 DIAGNOSIS — G3183 Dementia with Lewy bodies: Secondary | ICD-10-CM | POA: Diagnosis not present

## 2015-10-07 DIAGNOSIS — R630 Anorexia: Secondary | ICD-10-CM | POA: Diagnosis not present

## 2015-10-07 DIAGNOSIS — I10 Essential (primary) hypertension: Secondary | ICD-10-CM | POA: Diagnosis not present

## 2015-10-07 DIAGNOSIS — G9340 Encephalopathy, unspecified: Secondary | ICD-10-CM | POA: Diagnosis not present

## 2015-10-08 DIAGNOSIS — G9340 Encephalopathy, unspecified: Secondary | ICD-10-CM | POA: Diagnosis not present

## 2015-10-08 DIAGNOSIS — G3183 Dementia with Lewy bodies: Secondary | ICD-10-CM | POA: Diagnosis not present

## 2015-10-08 DIAGNOSIS — R630 Anorexia: Secondary | ICD-10-CM | POA: Diagnosis not present

## 2015-10-08 DIAGNOSIS — I10 Essential (primary) hypertension: Secondary | ICD-10-CM | POA: Diagnosis not present

## 2015-10-09 DIAGNOSIS — I10 Essential (primary) hypertension: Secondary | ICD-10-CM | POA: Diagnosis not present

## 2015-10-09 DIAGNOSIS — G3183 Dementia with Lewy bodies: Secondary | ICD-10-CM | POA: Diagnosis not present

## 2015-10-09 DIAGNOSIS — R630 Anorexia: Secondary | ICD-10-CM | POA: Diagnosis not present

## 2015-10-09 DIAGNOSIS — G9340 Encephalopathy, unspecified: Secondary | ICD-10-CM | POA: Diagnosis not present

## 2015-10-10 DIAGNOSIS — I10 Essential (primary) hypertension: Secondary | ICD-10-CM | POA: Diagnosis not present

## 2015-10-10 DIAGNOSIS — G3183 Dementia with Lewy bodies: Secondary | ICD-10-CM | POA: Diagnosis not present

## 2015-10-10 DIAGNOSIS — R630 Anorexia: Secondary | ICD-10-CM | POA: Diagnosis not present

## 2015-10-10 DIAGNOSIS — G9340 Encephalopathy, unspecified: Secondary | ICD-10-CM | POA: Diagnosis not present

## 2015-11-10 DIAGNOSIS — G9340 Encephalopathy, unspecified: Secondary | ICD-10-CM | POA: Diagnosis not present

## 2015-11-10 DIAGNOSIS — G3183 Dementia with Lewy bodies: Secondary | ICD-10-CM | POA: Diagnosis not present

## 2015-11-10 DIAGNOSIS — I10 Essential (primary) hypertension: Secondary | ICD-10-CM | POA: Diagnosis not present

## 2015-11-10 DIAGNOSIS — R630 Anorexia: Secondary | ICD-10-CM | POA: Diagnosis not present

## 2015-11-18 ENCOUNTER — Encounter: Payer: Self-pay | Admitting: Gastroenterology

## 2015-12-01 ENCOUNTER — Other Ambulatory Visit: Payer: Self-pay | Admitting: Neurology

## 2015-12-08 DIAGNOSIS — G9340 Encephalopathy, unspecified: Secondary | ICD-10-CM | POA: Diagnosis not present

## 2015-12-08 DIAGNOSIS — R630 Anorexia: Secondary | ICD-10-CM | POA: Diagnosis not present

## 2015-12-08 DIAGNOSIS — I10 Essential (primary) hypertension: Secondary | ICD-10-CM | POA: Diagnosis not present

## 2015-12-08 DIAGNOSIS — G3183 Dementia with Lewy bodies: Secondary | ICD-10-CM | POA: Diagnosis not present

## 2016-01-08 DIAGNOSIS — R630 Anorexia: Secondary | ICD-10-CM | POA: Diagnosis not present

## 2016-01-08 DIAGNOSIS — G9340 Encephalopathy, unspecified: Secondary | ICD-10-CM | POA: Diagnosis not present

## 2016-01-08 DIAGNOSIS — G3183 Dementia with Lewy bodies: Secondary | ICD-10-CM | POA: Diagnosis not present

## 2016-01-08 DIAGNOSIS — I10 Essential (primary) hypertension: Secondary | ICD-10-CM | POA: Diagnosis not present

## 2016-02-07 DIAGNOSIS — G3183 Dementia with Lewy bodies: Secondary | ICD-10-CM | POA: Diagnosis not present

## 2016-02-07 DIAGNOSIS — I10 Essential (primary) hypertension: Secondary | ICD-10-CM | POA: Diagnosis not present

## 2016-02-07 DIAGNOSIS — G9340 Encephalopathy, unspecified: Secondary | ICD-10-CM | POA: Diagnosis not present

## 2016-02-07 DIAGNOSIS — R630 Anorexia: Secondary | ICD-10-CM | POA: Diagnosis not present

## 2016-03-09 DIAGNOSIS — G3183 Dementia with Lewy bodies: Secondary | ICD-10-CM | POA: Diagnosis not present

## 2016-03-09 DIAGNOSIS — R630 Anorexia: Secondary | ICD-10-CM | POA: Diagnosis not present

## 2016-03-09 DIAGNOSIS — I10 Essential (primary) hypertension: Secondary | ICD-10-CM | POA: Diagnosis not present

## 2016-03-09 DIAGNOSIS — G9340 Encephalopathy, unspecified: Secondary | ICD-10-CM | POA: Diagnosis not present

## 2016-04-08 DIAGNOSIS — R630 Anorexia: Secondary | ICD-10-CM | POA: Diagnosis not present

## 2016-04-08 DIAGNOSIS — G9340 Encephalopathy, unspecified: Secondary | ICD-10-CM | POA: Diagnosis not present

## 2016-04-08 DIAGNOSIS — I10 Essential (primary) hypertension: Secondary | ICD-10-CM | POA: Diagnosis not present

## 2016-04-08 DIAGNOSIS — G3183 Dementia with Lewy bodies: Secondary | ICD-10-CM | POA: Diagnosis not present

## 2016-05-09 DIAGNOSIS — G3183 Dementia with Lewy bodies: Secondary | ICD-10-CM | POA: Diagnosis not present

## 2016-05-09 DIAGNOSIS — R630 Anorexia: Secondary | ICD-10-CM | POA: Diagnosis not present

## 2016-05-09 DIAGNOSIS — G9340 Encephalopathy, unspecified: Secondary | ICD-10-CM | POA: Diagnosis not present

## 2016-05-09 DIAGNOSIS — I10 Essential (primary) hypertension: Secondary | ICD-10-CM | POA: Diagnosis not present

## 2016-05-21 IMAGING — CR DG CHEST 2V
3 series · 3 of 3 positions shown · non-contrast
Comparison: 12/29/2013

CLINICAL DATA: Mental status changes.

EXAM:
CHEST  2 VIEW

[w chest lat (1 of 2)]
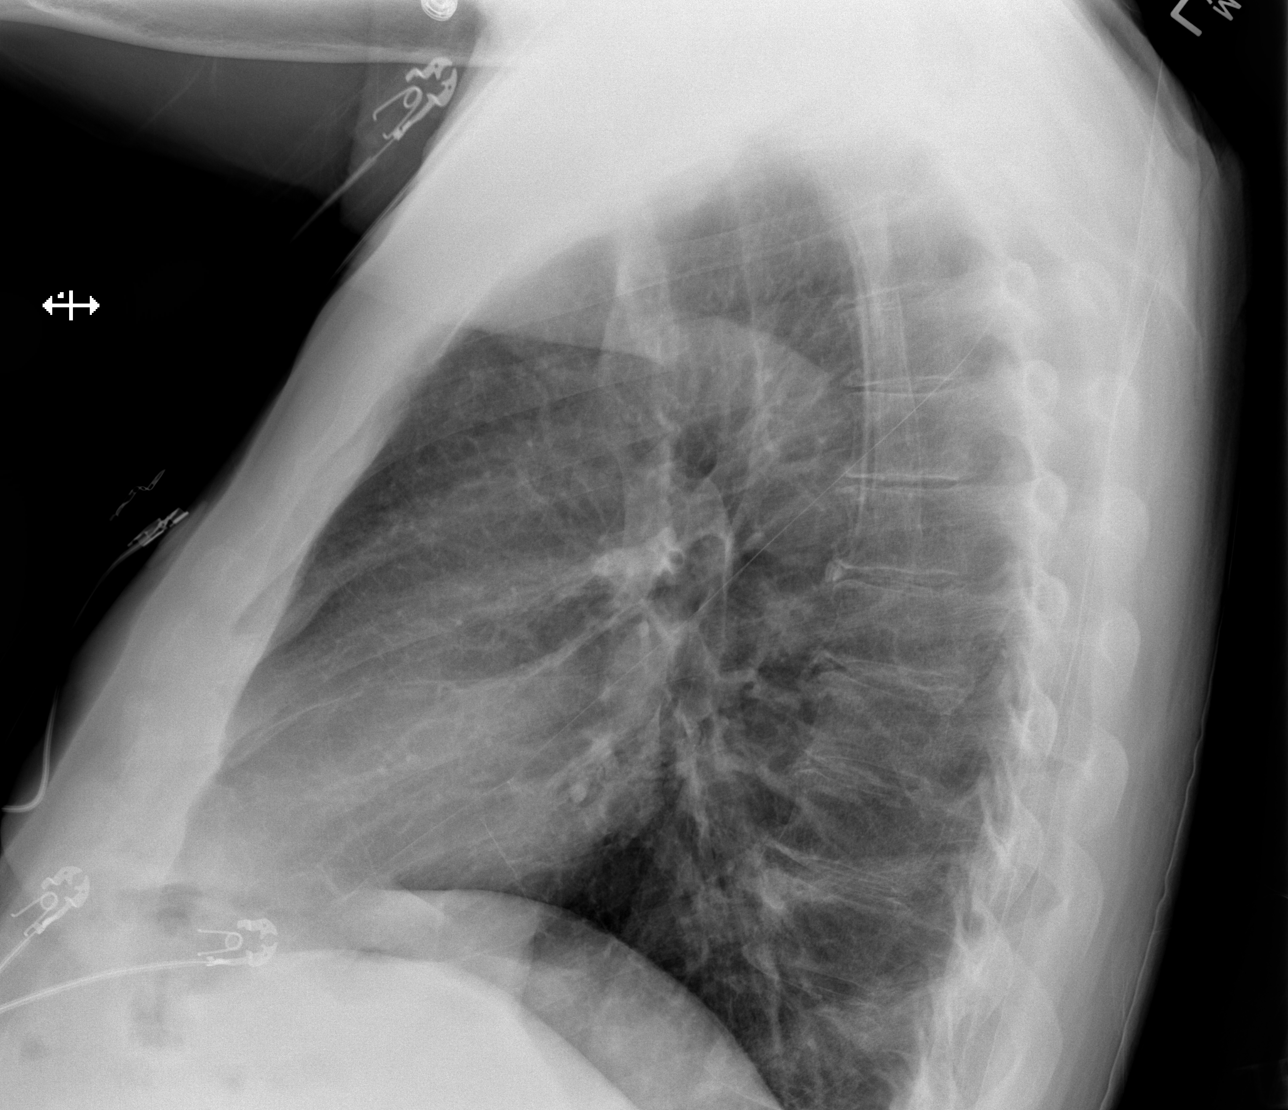

[w chest lat (2 of 2)]
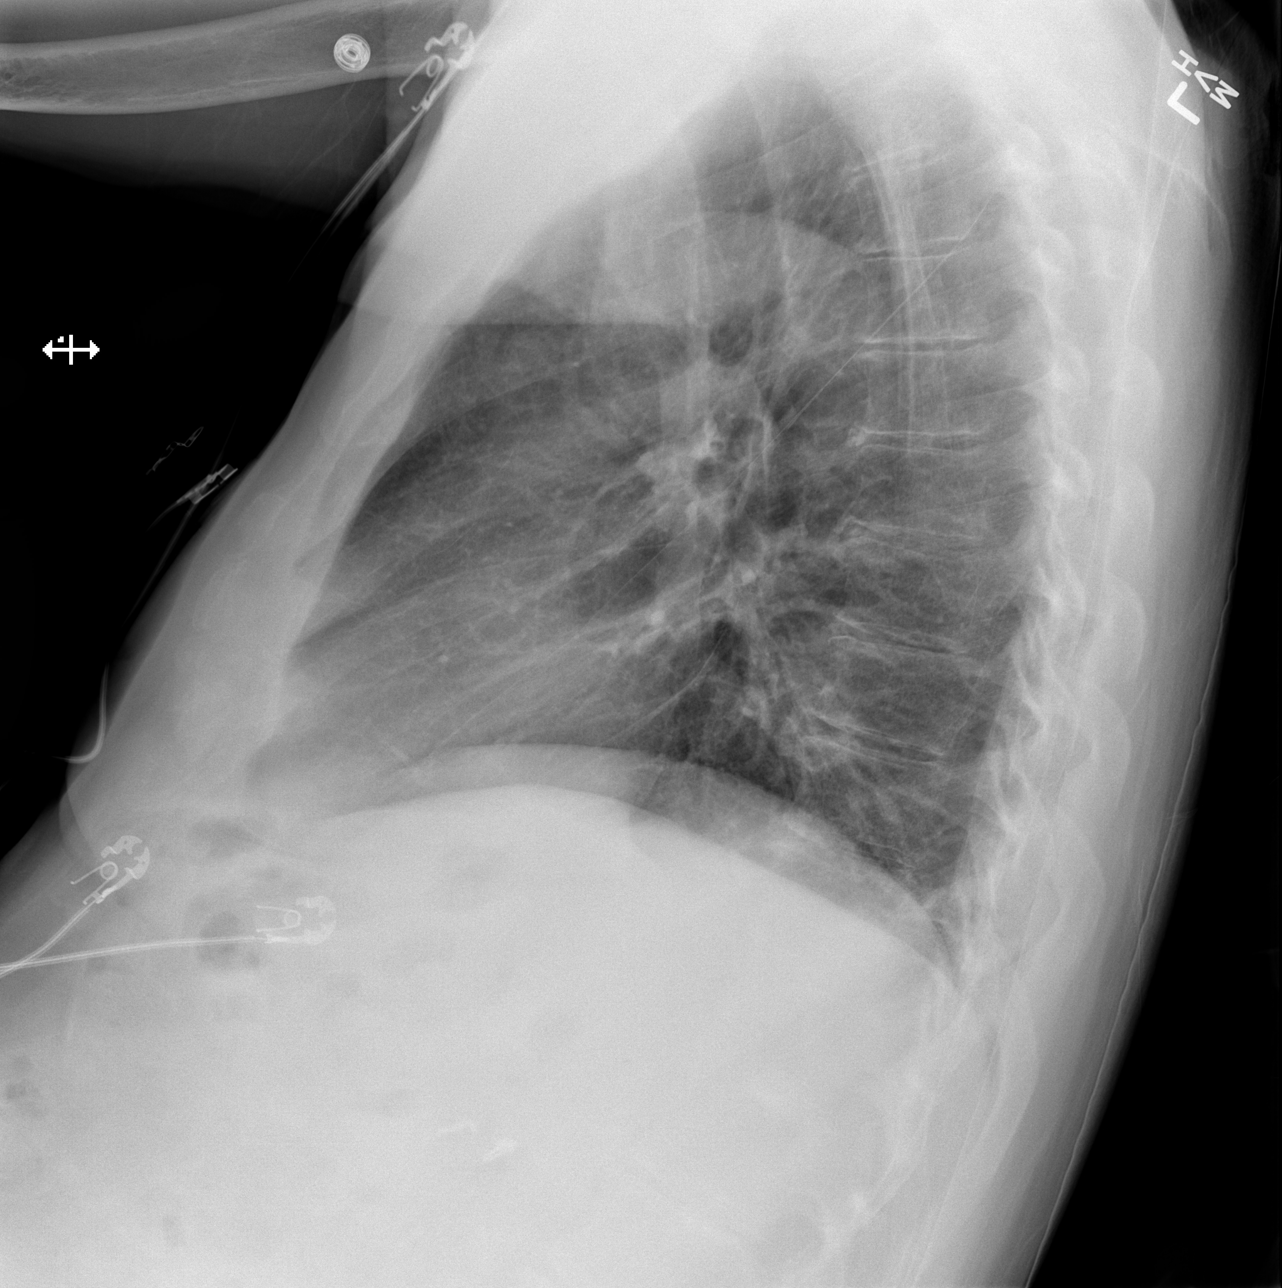

[x chest ap]
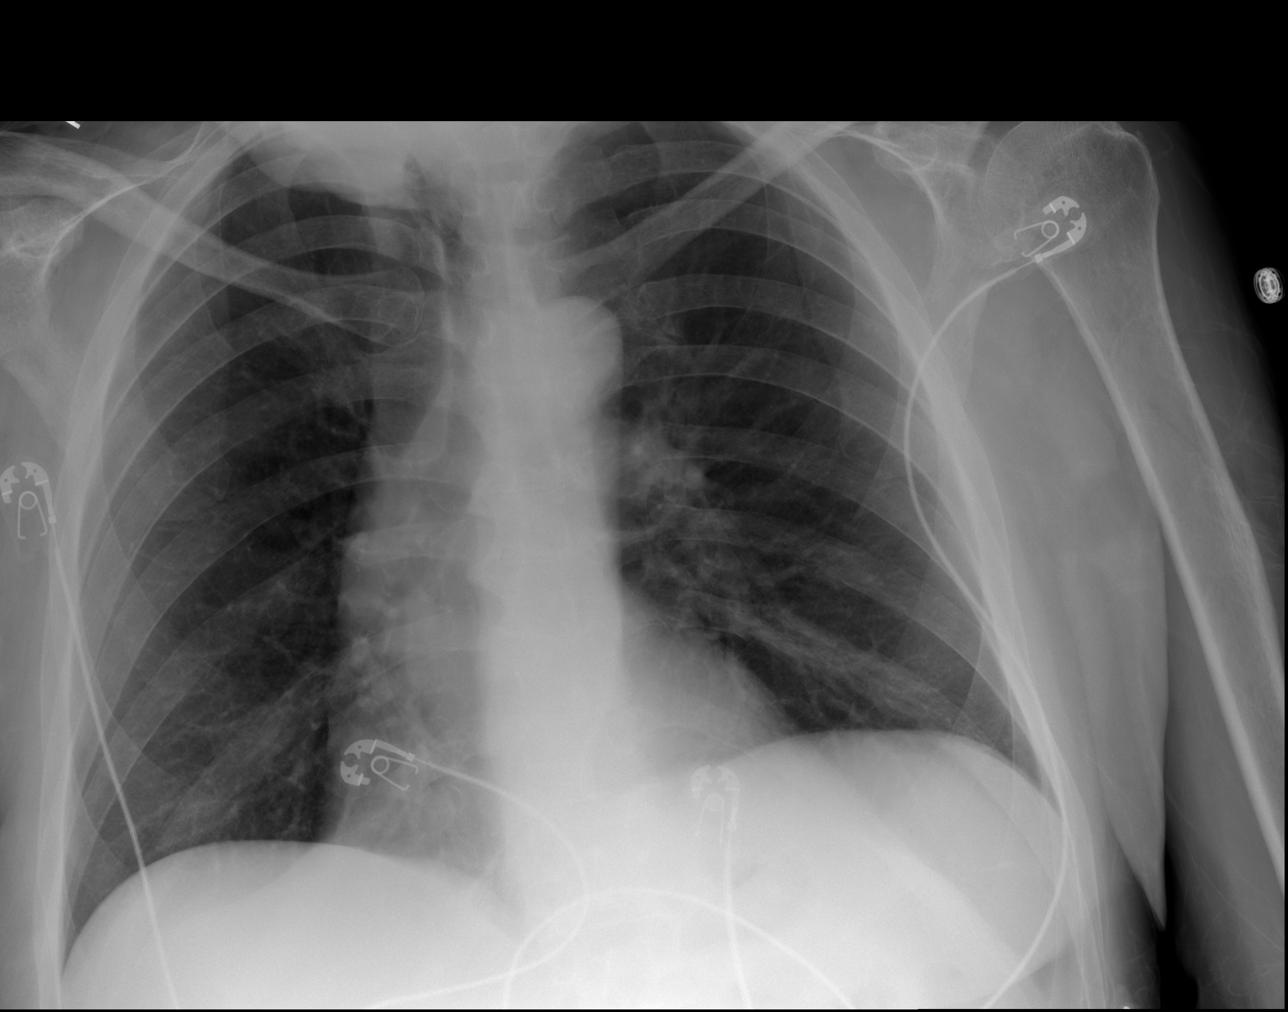

[3 of 3 positions shown; findings below may reference images not displayed]

FINDINGS: Mild cardiomegaly. Normal pulmonary vascularity. Lungs are clear. No
pneumothorax or pleural effusion. Thoracic spine is intact.
IMPRESSION: Cardiomegaly without decompensation.

## 2016-05-21 IMAGING — CT CT HEAD W/O CM
2 series · 16 of 30 positions shown, 19 images · non-contrast
Comparison: MRI of the brain 07/07/2013.

CLINICAL DATA: 69-year-old male with confusion and difficulty with
mobility. Urinary tract infection.

EXAM:
CT HEAD WITHOUT CONTRAST
TECHNIQUE: Contiguous axial images were obtained from the base of the skull
through the vertex without intravenous contrast.

[Series 2: head w/o · axial · non-contrast · 0.45mm/px · z∈[+1556,+1681]mm · 9 of 33 slices shown, 12 images]
[im 4/33  brain]
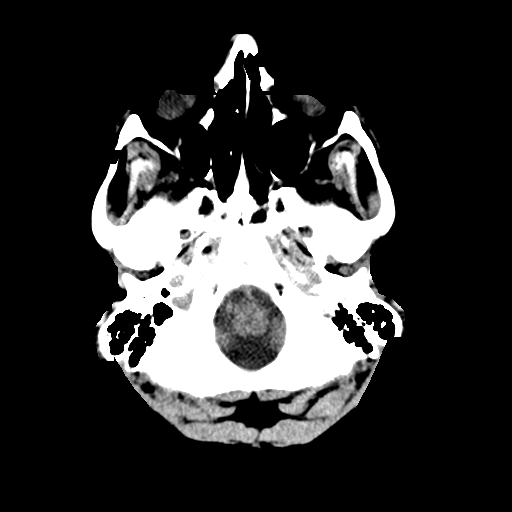
[im 4/33  bone]
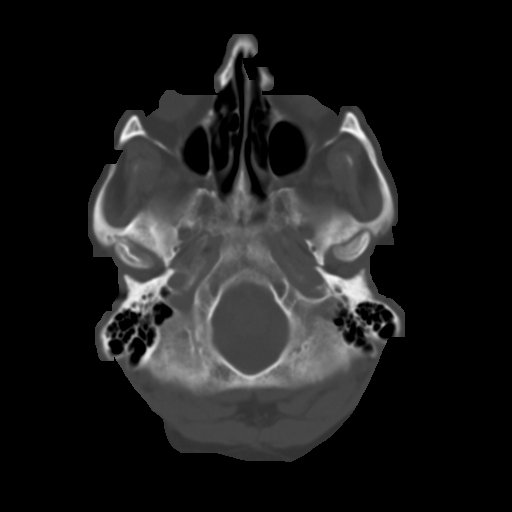
[im 7/33  brain]
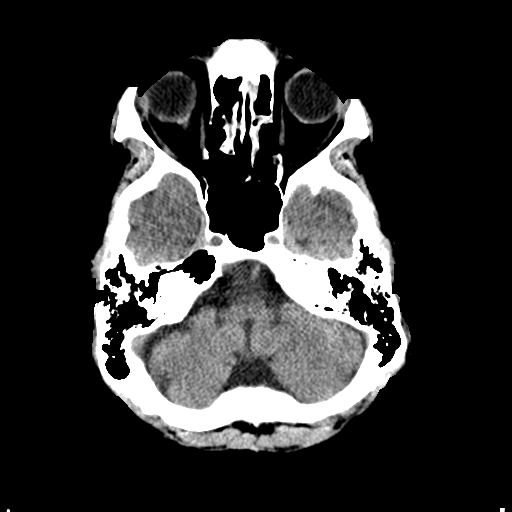
[im 10/33  brain]
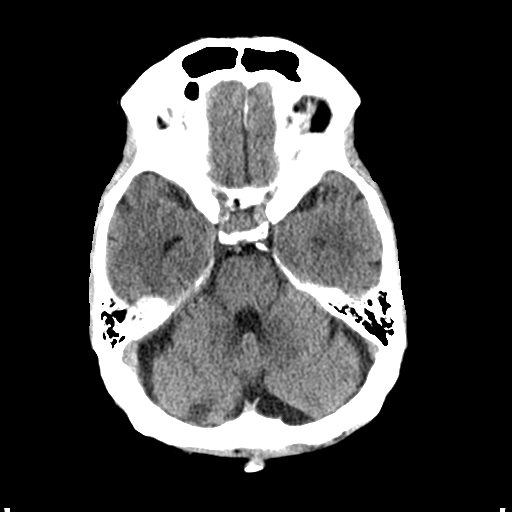
[im 13/33  brain]
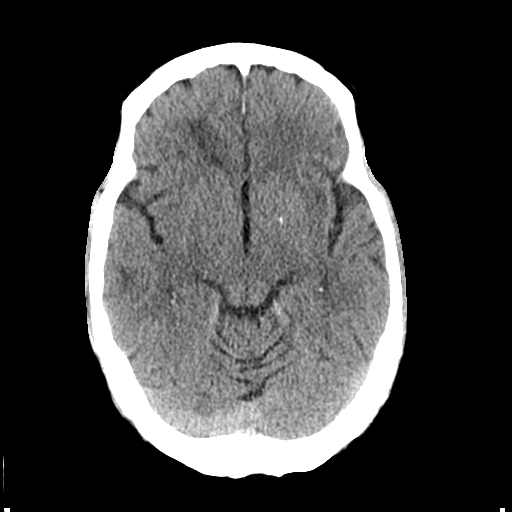
[im 17/33  brain]
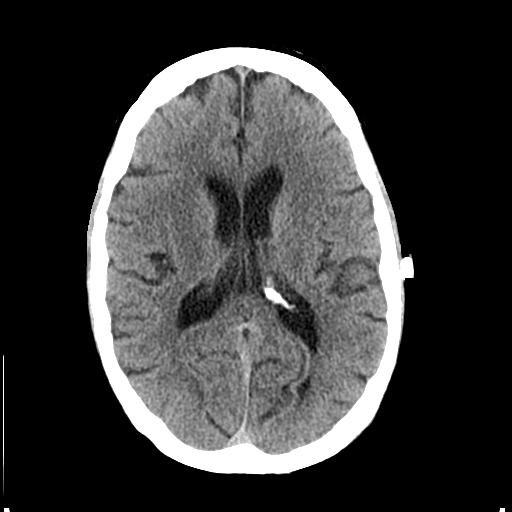
[im 17/33  bone]
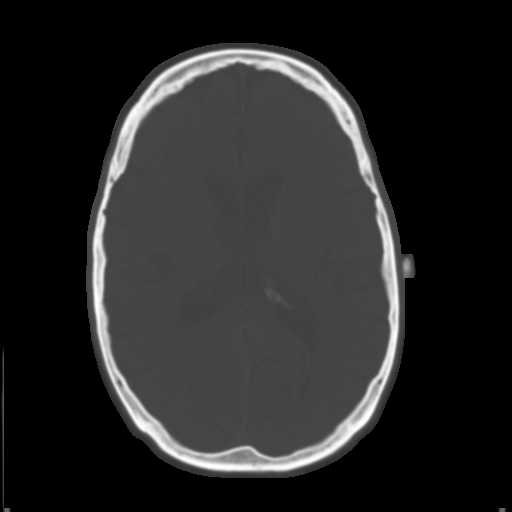
[im 20/33  brain]
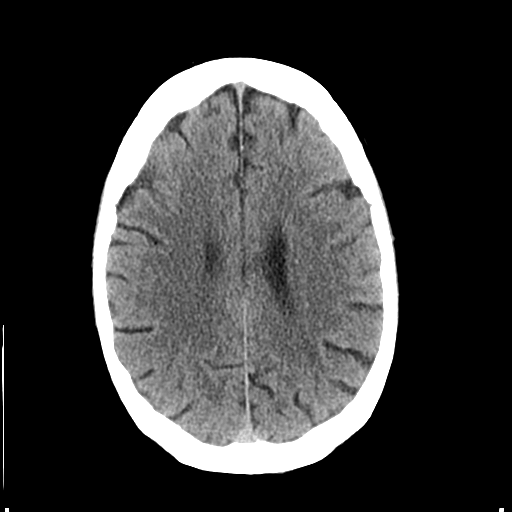
[im 23/33  brain]
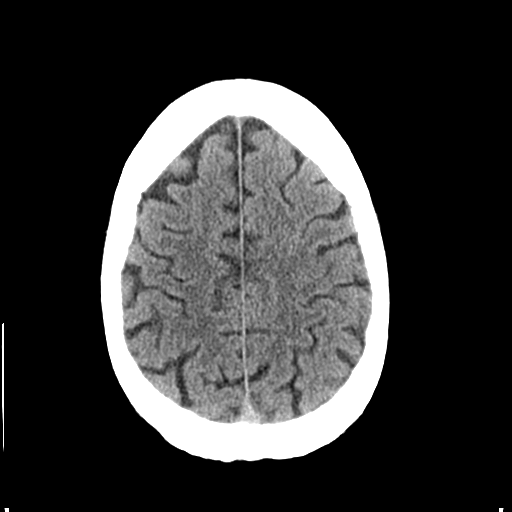
[im 26/33  brain]
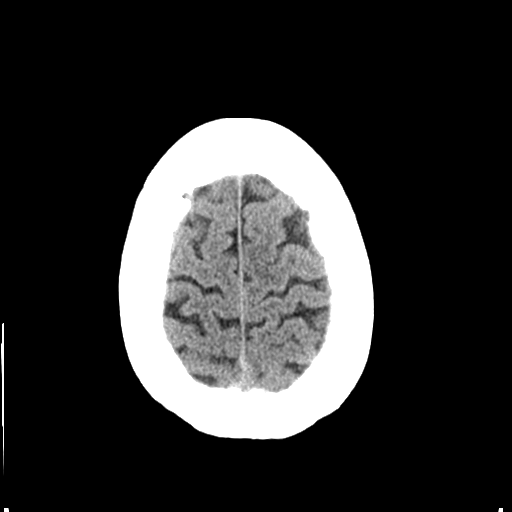
[im 29/33  brain]
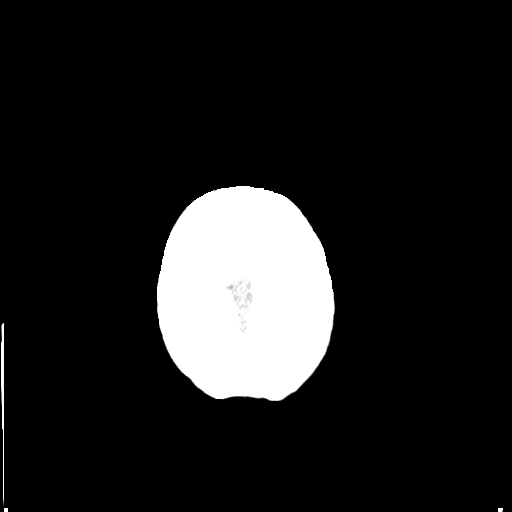
[im 29/33  bone]
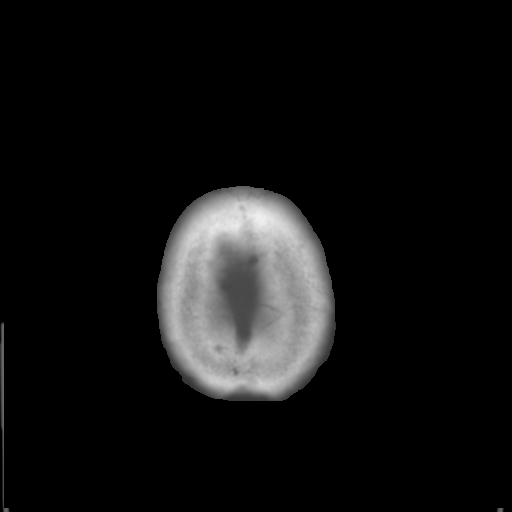

[Series 3: bone windows · axial · 0.45mm/px · z∈[+1559,+1667]mm · 7 of 55 slices shown]
[im 7/55  bone]
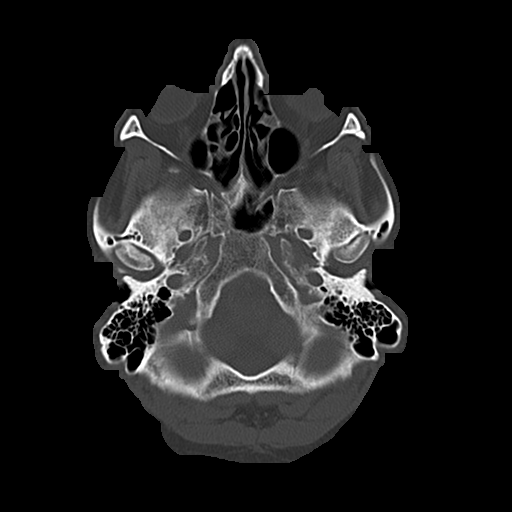
[im 13/55  bone]
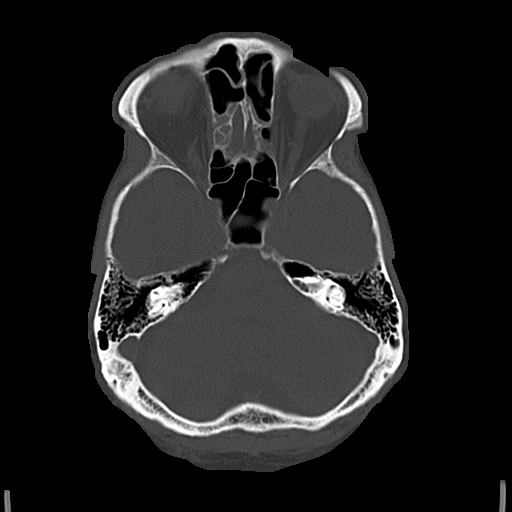
[im 19/55  bone]
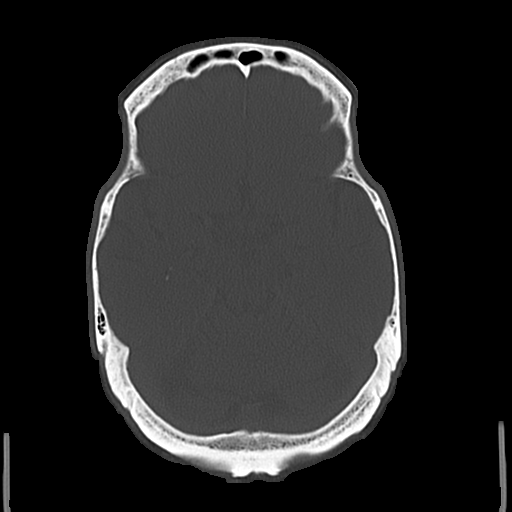
[im 25/55  bone]
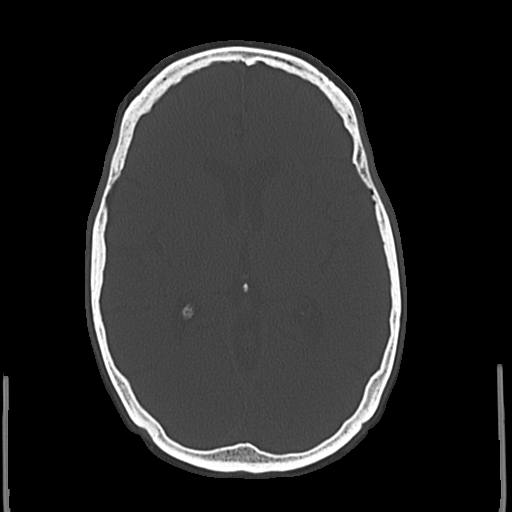
[im 31/55  bone]
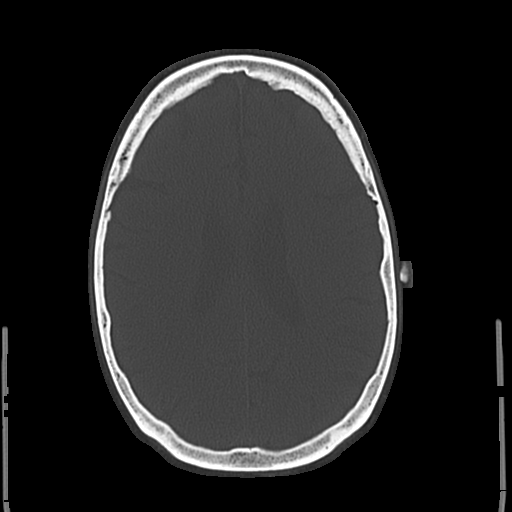
[im 37/55  bone]
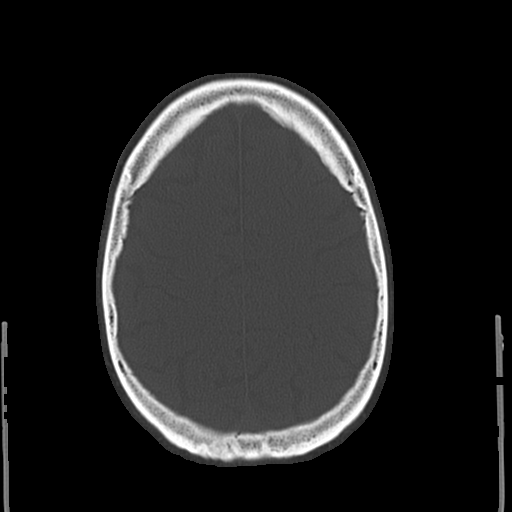
[im 43/55  bone]
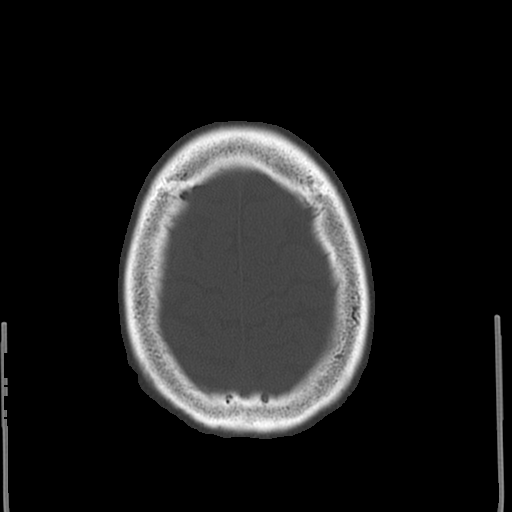

[16 of 30 positions shown; findings below may reference images not displayed]

FINDINGS: Patchy and confluent areas of decreased attenuation are noted
throughout the deep and periventricular white matter of the cerebral
hemispheres bilaterally, compatible with chronic microvascular
ischemic disease. Physiologic calcifications of the left basal
ganglia. No acute intracranial abnormalities. Specifically, no
evidence of acute intracranial hemorrhage, no definite findings of
acute/subacute cerebral ischemia, no mass, mass effect,
hydrocephalus or abnormal intra or extra-axial fluid collections.
Visualized paranasal sinuses and mastoids are well pneumatized, with
exception of some mild multifocal mucosal thickening throughout the
ethmoid sinuses bilaterally. No acute displaced skull fractures are
identified.
IMPRESSION: 1. No acute intracranial abnormalities.
2. Mild chronic microvascular ischemic changes in the cerebral white
matter, as above.

## 2016-06-09 DIAGNOSIS — I10 Essential (primary) hypertension: Secondary | ICD-10-CM | POA: Diagnosis not present

## 2016-06-09 DIAGNOSIS — G3183 Dementia with Lewy bodies: Secondary | ICD-10-CM | POA: Diagnosis not present

## 2016-06-09 DIAGNOSIS — G9340 Encephalopathy, unspecified: Secondary | ICD-10-CM | POA: Diagnosis not present

## 2016-06-09 DIAGNOSIS — R630 Anorexia: Secondary | ICD-10-CM | POA: Diagnosis not present

## 2016-09-29 DIAGNOSIS — Z7401 Bed confinement status: Secondary | ICD-10-CM | POA: Diagnosis not present

## 2016-09-29 DIAGNOSIS — G4700 Insomnia, unspecified: Secondary | ICD-10-CM | POA: Diagnosis not present

## 2016-09-29 DIAGNOSIS — G35 Multiple sclerosis: Secondary | ICD-10-CM | POA: Diagnosis not present

## 2016-09-29 DIAGNOSIS — G3183 Dementia with Lewy bodies: Secondary | ICD-10-CM | POA: Diagnosis not present

## 2016-09-29 DIAGNOSIS — R32 Unspecified urinary incontinence: Secondary | ICD-10-CM | POA: Diagnosis not present

## 2016-09-29 DIAGNOSIS — N390 Urinary tract infection, site not specified: Secondary | ICD-10-CM | POA: Diagnosis not present

## 2016-09-29 DIAGNOSIS — I10 Essential (primary) hypertension: Secondary | ICD-10-CM | POA: Diagnosis not present

## 2016-10-18 DIAGNOSIS — Z79899 Other long term (current) drug therapy: Secondary | ICD-10-CM | POA: Diagnosis not present

## 2016-12-12 ENCOUNTER — Emergency Department (HOSPITAL_COMMUNITY): Payer: Medicare Other

## 2016-12-12 ENCOUNTER — Encounter (HOSPITAL_COMMUNITY): Payer: Self-pay

## 2016-12-12 ENCOUNTER — Inpatient Hospital Stay (HOSPITAL_COMMUNITY)
Admission: EM | Admit: 2016-12-12 | Discharge: 2016-12-18 | DRG: 871 | Disposition: A | Discharge status: Home / Self Care | Payer: Medicare Other | Attending: Internal Medicine | Admitting: Internal Medicine

## 2016-12-12 DIAGNOSIS — G35 Multiple sclerosis: Secondary | ICD-10-CM | POA: Diagnosis not present

## 2016-12-12 DIAGNOSIS — I10 Essential (primary) hypertension: Secondary | ICD-10-CM | POA: Diagnosis not present

## 2016-12-12 DIAGNOSIS — N390 Urinary tract infection, site not specified: Secondary | ICD-10-CM | POA: Diagnosis not present

## 2016-12-12 DIAGNOSIS — J181 Lobar pneumonia, unspecified organism: Secondary | ICD-10-CM | POA: Diagnosis not present

## 2016-12-12 DIAGNOSIS — R627 Adult failure to thrive: Secondary | ICD-10-CM | POA: Diagnosis not present

## 2016-12-12 DIAGNOSIS — G3183 Dementia with Lewy bodies: Secondary | ICD-10-CM | POA: Diagnosis present

## 2016-12-12 DIAGNOSIS — R404 Transient alteration of awareness: Secondary | ICD-10-CM | POA: Diagnosis not present

## 2016-12-12 DIAGNOSIS — Z7189 Other specified counseling: Secondary | ICD-10-CM

## 2016-12-12 DIAGNOSIS — E876 Hypokalemia: Secondary | ICD-10-CM | POA: Diagnosis present

## 2016-12-12 DIAGNOSIS — J9691 Respiratory failure, unspecified with hypoxia: Secondary | ICD-10-CM | POA: Diagnosis present

## 2016-12-12 DIAGNOSIS — Z87891 Personal history of nicotine dependence: Secondary | ICD-10-CM

## 2016-12-12 DIAGNOSIS — Z09 Encounter for follow-up examination after completed treatment for conditions other than malignant neoplasm: Secondary | ICD-10-CM

## 2016-12-12 DIAGNOSIS — R109 Unspecified abdominal pain: Secondary | ICD-10-CM

## 2016-12-12 DIAGNOSIS — Z66 Do not resuscitate: Secondary | ICD-10-CM | POA: Diagnosis present

## 2016-12-12 DIAGNOSIS — R630 Anorexia: Secondary | ICD-10-CM | POA: Diagnosis not present

## 2016-12-12 DIAGNOSIS — J690 Pneumonitis due to inhalation of food and vomit: Secondary | ICD-10-CM | POA: Diagnosis not present

## 2016-12-12 DIAGNOSIS — R1310 Dysphagia, unspecified: Secondary | ICD-10-CM | POA: Diagnosis present

## 2016-12-12 DIAGNOSIS — Z515 Encounter for palliative care: Secondary | ICD-10-CM | POA: Diagnosis not present

## 2016-12-12 DIAGNOSIS — R112 Nausea with vomiting, unspecified: Secondary | ICD-10-CM | POA: Diagnosis not present

## 2016-12-12 DIAGNOSIS — F0390 Unspecified dementia without behavioral disturbance: Secondary | ICD-10-CM

## 2016-12-12 DIAGNOSIS — J189 Pneumonia, unspecified organism: Secondary | ICD-10-CM | POA: Diagnosis not present

## 2016-12-12 DIAGNOSIS — E43 Unspecified severe protein-calorie malnutrition: Secondary | ICD-10-CM | POA: Diagnosis present

## 2016-12-12 DIAGNOSIS — Z7401 Bed confinement status: Secondary | ICD-10-CM | POA: Diagnosis not present

## 2016-12-12 DIAGNOSIS — R4182 Altered mental status, unspecified: Secondary | ICD-10-CM | POA: Diagnosis not present

## 2016-12-12 DIAGNOSIS — R509 Fever, unspecified: Secondary | ICD-10-CM

## 2016-12-12 DIAGNOSIS — F0280 Dementia in other diseases classified elsewhere without behavioral disturbance: Secondary | ICD-10-CM | POA: Diagnosis present

## 2016-12-12 DIAGNOSIS — A419 Sepsis, unspecified organism: Principal | ICD-10-CM | POA: Diagnosis present

## 2016-12-12 DIAGNOSIS — I4510 Unspecified right bundle-branch block: Secondary | ICD-10-CM | POA: Diagnosis present

## 2016-12-12 DIAGNOSIS — Z681 Body mass index (BMI) 19 or less, adult: Secondary | ICD-10-CM | POA: Diagnosis not present

## 2016-12-12 DIAGNOSIS — R05 Cough: Secondary | ICD-10-CM | POA: Diagnosis not present

## 2016-12-12 DIAGNOSIS — R0602 Shortness of breath: Secondary | ICD-10-CM | POA: Diagnosis not present

## 2016-12-12 DIAGNOSIS — J9601 Acute respiratory failure with hypoxia: Secondary | ICD-10-CM | POA: Diagnosis not present

## 2016-12-12 DIAGNOSIS — Z79899 Other long term (current) drug therapy: Secondary | ICD-10-CM | POA: Diagnosis not present

## 2016-12-12 DIAGNOSIS — F0150 Vascular dementia without behavioral disturbance: Secondary | ICD-10-CM | POA: Diagnosis not present

## 2016-12-12 DIAGNOSIS — R32 Unspecified urinary incontinence: Secondary | ICD-10-CM | POA: Diagnosis not present

## 2016-12-12 HISTORY — DX: Unspecified dementia, unspecified severity, without behavioral disturbance, psychotic disturbance, mood disturbance, and anxiety: F03.90

## 2016-12-12 LAB — PROTIME-INR
INR: 1.26
Prothrombin Time: 15.9 seconds — ABNORMAL HIGH (ref 11.4–15.2)

## 2016-12-12 LAB — APTT: aPTT: 33 seconds (ref 24–36)

## 2016-12-12 LAB — LACTIC ACID, PLASMA
LACTIC ACID, VENOUS: 0.8 mmol/L (ref 0.5–1.9)
LACTIC ACID, VENOUS: 2 mmol/L — AB (ref 0.5–1.9)

## 2016-12-12 LAB — CBC WITH DIFFERENTIAL/PLATELET
Basophils Absolute: 0 10*3/uL (ref 0.0–0.1)
Basophils Relative: 0 %
EOS ABS: 0 10*3/uL (ref 0.0–0.7)
Eosinophils Relative: 0 %
HCT: 38.9 % — ABNORMAL LOW (ref 39.0–52.0)
HEMOGLOBIN: 13 g/dL (ref 13.0–17.0)
Lymphocytes Relative: 2 %
Lymphs Abs: 0.4 10*3/uL — ABNORMAL LOW (ref 0.7–4.0)
MCH: 31.4 pg (ref 26.0–34.0)
MCHC: 33.4 g/dL (ref 30.0–36.0)
MCV: 94 fL (ref 78.0–100.0)
MONOS PCT: 8 %
Monocytes Absolute: 1.2 10*3/uL — ABNORMAL HIGH (ref 0.1–1.0)
NEUTROS PCT: 90 %
Neutro Abs: 12.9 10*3/uL — ABNORMAL HIGH (ref 1.7–7.7)
Platelets: 284 10*3/uL (ref 150–400)
RBC: 4.14 MIL/uL — AB (ref 4.22–5.81)
RDW: 13 % (ref 11.5–15.5)
WBC: 14.4 10*3/uL — ABNORMAL HIGH (ref 4.0–10.5)

## 2016-12-12 LAB — URINALYSIS, ROUTINE W REFLEX MICROSCOPIC
Bacteria, UA: NONE SEEN
Bilirubin Urine: NEGATIVE
GLUCOSE, UA: NEGATIVE mg/dL
Ketones, ur: 20 mg/dL — AB
Leukocytes, UA: NEGATIVE
Nitrite: NEGATIVE
PROTEIN: NEGATIVE mg/dL
Specific Gravity, Urine: 1.013 (ref 1.005–1.030)
pH: 5 (ref 5.0–8.0)

## 2016-12-12 LAB — COMPREHENSIVE METABOLIC PANEL
ALK PHOS: 70 U/L (ref 38–126)
ALT: 29 U/L (ref 17–63)
AST: 24 U/L (ref 15–41)
Albumin: 3.5 g/dL (ref 3.5–5.0)
Anion gap: 8 (ref 5–15)
BUN: 15 mg/dL (ref 6–20)
CALCIUM: 8.8 mg/dL — AB (ref 8.9–10.3)
CO2: 25 mmol/L (ref 22–32)
CREATININE: 0.91 mg/dL (ref 0.61–1.24)
Chloride: 107 mmol/L (ref 101–111)
Glucose, Bld: 121 mg/dL — ABNORMAL HIGH (ref 65–99)
Potassium: 3.9 mmol/L (ref 3.5–5.1)
Sodium: 140 mmol/L (ref 135–145)
Total Bilirubin: 1.1 mg/dL (ref 0.3–1.2)
Total Protein: 6.8 g/dL (ref 6.5–8.1)

## 2016-12-12 LAB — I-STAT CHEM 8, ED
BUN: 15 mg/dL (ref 6–20)
CALCIUM ION: 1.09 mmol/L — AB (ref 1.15–1.40)
CHLORIDE: 103 mmol/L (ref 101–111)
CREATININE: 0.9 mg/dL (ref 0.61–1.24)
GLUCOSE: 121 mg/dL — AB (ref 65–99)
HCT: 39 % (ref 39.0–52.0)
Hemoglobin: 13.3 g/dL (ref 13.0–17.0)
Potassium: 3.8 mmol/L (ref 3.5–5.1)
Sodium: 142 mmol/L (ref 135–145)
TCO2: 26 mmol/L (ref 0–100)

## 2016-12-12 LAB — MAGNESIUM: MAGNESIUM: 1.5 mg/dL — AB (ref 1.7–2.4)

## 2016-12-12 LAB — I-STAT CG4 LACTIC ACID, ED: Lactic Acid, Venous: 1.19 mmol/L (ref 0.5–1.9)

## 2016-12-12 LAB — PROCALCITONIN: Procalcitonin: 0.69 ng/mL

## 2016-12-12 LAB — CBG MONITORING, ED: GLUCOSE-CAPILLARY: 117 mg/dL — AB (ref 65–99)

## 2016-12-12 MED ORDER — ORAL CARE MOUTH RINSE
15.0000 mL | Freq: Two times a day (BID) | OROMUCOSAL | Status: DC
  Administered 2016-12-12 – 2016-12-17 (×11): 15 mL via OROMUCOSAL

## 2016-12-12 MED ORDER — ONDANSETRON HCL 4 MG/2ML IJ SOLN: 4.0000 mg | Freq: Four times a day (QID) | INTRAMUSCULAR | Status: DC | PRN

## 2016-12-12 MED ORDER — DEXTROSE 5 % IV SOLN
1.0000 g | Freq: Once | INTRAVENOUS | Status: AC
  Administered 2016-12-12: 1 g via INTRAVENOUS
  Filled 2016-12-12: qty 10

## 2016-12-12 MED ORDER — CLONAZEPAM 1 MG PO TABS
1.0000 mg | ORAL_TABLET | Freq: Every day | ORAL | Status: DC
  Administered 2016-12-13 – 2016-12-16 (×3): 1 mg via ORAL
  Filled 2016-12-12 (×3): qty 1

## 2016-12-12 MED ORDER — RIVASTIGMINE 4.6 MG/24HR TD PT24
4.6000 mg | MEDICATED_PATCH | Freq: Every day | TRANSDERMAL | Status: DC
Start: 2016-12-12 — End: 2016-12-14
  Administered 2016-12-13 – 2016-12-14 (×2): 4.6 mg via TRANSDERMAL
  Filled 2016-12-12 (×4): qty 1

## 2016-12-12 MED ORDER — LEVALBUTEROL HCL 0.63 MG/3ML IN NEBU: 0.6300 mg | INHALATION_SOLUTION | Freq: Four times a day (QID) | RESPIRATORY_TRACT | Status: DC

## 2016-12-12 MED ORDER — POLYETHYLENE GLYCOL 3350 17 G PO PACK
17.0000 g | PACK | ORAL | Status: DC
  Filled 2016-12-12 (×3): qty 1

## 2016-12-12 MED ORDER — ACETAMINOPHEN 650 MG RE SUPP
650.0000 mg | Freq: Once | RECTAL | Status: AC
  Administered 2016-12-12: 650 mg via RECTAL
  Filled 2016-12-12: qty 1

## 2016-12-12 MED ORDER — LEVALBUTEROL HCL 0.63 MG/3ML IN NEBU: 0.6300 mg | INHALATION_SOLUTION | Freq: Four times a day (QID) | RESPIRATORY_TRACT | Status: DC | PRN

## 2016-12-12 MED ORDER — DEXTROSE 5 % IV SOLN: 500.0000 mg | Freq: Once | INTRAVENOUS | Status: DC

## 2016-12-12 MED ORDER — ACETAMINOPHEN 650 MG RE SUPP
650.0000 mg | Freq: Four times a day (QID) | RECTAL | Status: DC | PRN
  Administered 2016-12-14 – 2016-12-18 (×3): 650 mg via RECTAL
  Filled 2016-12-12 (×4): qty 1

## 2016-12-12 MED ORDER — SODIUM CHLORIDE 0.9 % IV BOLUS (SEPSIS)
1000.0000 mL | Freq: Once | INTRAVENOUS | Status: AC
  Administered 2016-12-12: 1000 mL via INTRAVENOUS

## 2016-12-12 MED ORDER — ACETAMINOPHEN 325 MG PO TABS
650.0000 mg | ORAL_TABLET | Freq: Four times a day (QID) | ORAL | Status: DC | PRN
  Administered 2016-12-15: 650 mg via ORAL

## 2016-12-12 MED ORDER — SODIUM CHLORIDE 0.9 % IV BOLUS (SEPSIS)
500.0000 mL | Freq: Once | INTRAVENOUS | Status: AC
  Administered 2016-12-12: 500 mL via INTRAVENOUS

## 2016-12-12 MED ORDER — SODIUM CHLORIDE 0.9% FLUSH
3.0000 mL | Freq: Two times a day (BID) | INTRAVENOUS | Status: DC
  Administered 2016-12-13 – 2016-12-17 (×3): 3 mL via INTRAVENOUS

## 2016-12-12 MED ORDER — DEXTROSE 5 % IV SOLN
1.0000 g | INTRAVENOUS | Status: DC
  Administered 2016-12-13 – 2016-12-14 (×2): 1 g via INTRAVENOUS
  Filled 2016-12-12 (×2): qty 10

## 2016-12-12 MED ORDER — AZITHROMYCIN 500 MG IV SOLR
500.0000 mg | INTRAVENOUS | Status: DC
  Administered 2016-12-13 – 2016-12-14 (×2): 500 mg via INTRAVENOUS
  Filled 2016-12-12 (×2): qty 500

## 2016-12-12 MED ORDER — DEXTROSE 5 % IV SOLN
500.0000 mg | Freq: Once | INTRAVENOUS | Status: AC
  Administered 2016-12-12: 500 mg via INTRAVENOUS
  Filled 2016-12-12: qty 500

## 2016-12-12 MED ORDER — DARIFENACIN HYDROBROMIDE ER 7.5 MG PO TB24
7.5000 mg | ORAL_TABLET | Freq: Every day | ORAL | Status: DC
  Administered 2016-12-17: 7.5 mg via ORAL
  Filled 2016-12-12 (×8): qty 1

## 2016-12-12 MED ORDER — QUETIAPINE FUMARATE 25 MG PO TABS
25.0000 mg | ORAL_TABLET | Freq: Every day | ORAL | Status: DC
  Administered 2016-12-13 – 2016-12-16 (×3): 25 mg via ORAL
  Filled 2016-12-12 (×3): qty 1

## 2016-12-12 MED ORDER — SODIUM CHLORIDE 0.9 % IV SOLN
INTRAVENOUS | Status: DC
  Administered 2016-12-12 – 2016-12-17 (×11): via INTRAVENOUS

## 2016-12-12 MED ORDER — ONDANSETRON HCL 4 MG PO TABS
4.0000 mg | ORAL_TABLET | Freq: Four times a day (QID) | ORAL | Status: DC | PRN
Start: 2016-12-12 — End: 2016-12-18

## 2016-12-12 MED ORDER — CEFTRIAXONE SODIUM 1 G IJ SOLR: 1.0000 g | Freq: Once | INTRAMUSCULAR | Status: DC

## 2016-12-12 MED ORDER — ENOXAPARIN SODIUM 40 MG/0.4ML ~~LOC~~ SOLN
40.0000 mg | SUBCUTANEOUS | Status: DC
  Administered 2016-12-13 – 2016-12-17 (×6): 40 mg via SUBCUTANEOUS
  Filled 2016-12-12 (×6): qty 0.4

## 2016-12-12 NOTE — ED Notes (Signed)
Bed: [id number] Expected date:  Expected time:  Means of arrival:  Comments: EMS lethargy

## 2016-12-12 NOTE — Care Management Note (Signed)
Case Management Note  Patient Details  Name: Luis Shaffer MRN: [id number] Date of Birth: Oct 19, 1945  Subjective/Objective:                  Patient lives at home alone with caretakers. Fever reported today with fever 104.0 per caretaker. Patient is bedridden and has dementia. Caretaker reported that patient is normally verbal, but is not now.  Action/Plan: Admit status INPATIENT (sepsis); anticipate discharge [facility].   Expected Discharge Date:   (unsure)               Expected Discharge Plan:  [place]  In-House Referral:  NA  Discharge planning Services  CM Consult  Post Acute Care Choice:  Resumption of Svcs/PTA Provider Choice offered to:     DME Arranged:    DME Agency:     HH Arranged:    [place] Agency:     Status of Service:  In process, will continue to follow  If discussed at Long Length of Stay Meetings, dates discussed:    Additional Comments:  Fuller Mandril, RN 12/12/2016, 1:43 PM

## 2016-12-12 NOTE — H&P (Signed)
[facility] History and Physical  Luis Shaffer [id number] DOB: 03-06-1946 DOA: 12/12/2016  Referring physician:   PCP: [facility]   Chief Complaint: Lethargy   HPI:   71 year old male with a history of [name], relapsing remitting multiple sclerosis since 1998, followed by [facility], who was brought in today by caregiver and nephew who is power of attorney , for rapid decline, gait instability, poor oral intake, lethargy, altered mental status. Patient had a rectal temperature 104 this morning. Over the last 2 weeks caregiver has noted decreasing urine output, decreased bowel movements, less by mouth intake. Patient is not very communicative at baseline. According to the nephew patient was on hospice until October 2017. He was discharged from hospice because of his steady course and minimal decline. ED course Patient was found to be hypoxic on arrival and  85% on room air BP (!) 154/122 (BP Location: Right Arm)   Pulse 117   Temp 102.4 F (39.1 C) (Rectal)   Resp 20   Pro calcitonin 0.69, lactic acid 1.19, potassium 3.8. UA negative. CT head shows no acute abnormality. Chest x-ray compatible with right basilar pneumonia. Patient admitted for pneumonia    Review of Systems: negative for the following  Unable to perform ROS: Mental status change       Past Medical History:  Diagnosis Date  . Dementia   . Hernia   . High blood pressure   . Multiple sclerosis ([place])   . Paresthesia      Past Surgical History:  Procedure Laterality Date  . GALLBLADDER SURGERY  2003      Social History:  reports that he has quit smoking. His smoking use included Cigarettes. He has never used smokeless tobacco. He reports that he does not drink alcohol or use drugs.    No Known Allergies  Family History  Problem Relation Age of Onset  . Breast cancer Mother   . Bone cancer Mother   . Kidney failure Father         Prior  to Admission medications   Medication Sig Start Date End Date Taking? Authorizing Provider  clonazePAM (KLONOPIN) 1 MG tablet Take 1 tablet (1 mg total) by mouth at bedtime. 08/09/15  Yes Marcial Pacas, MD  polyethylene glycol (MIRALAX / GLYCOLAX) packet Take 17 g by mouth every morning.   Yes Historical Provider, MD  QUEtiapine (SEROQUEL) 25 MG tablet Take 25 mg by mouth at bedtime.   Yes Historical Provider, MD  VESICARE 5 MG tablet Take 5 mg by mouth daily at 12 noon.  07/30/15  Yes Historical Provider, MD  rivastigmine (EXELON) 4.6 mg/24hr Place 1 patch (4.6 mg total) onto the skin daily. Patient not taking: Reported on 12/12/2016 08/24/15   Marcial Pacas, MD  rivastigmine (EXELON) 9.5 mg/24hr Place 1 patch (9.5 mg total) onto the skin daily. Patient not taking: Reported on 12/12/2016 08/24/15   Marcial Pacas, MD     Physical Exam: Vitals:   12/12/16 1300 12/12/16 1330 12/12/16 1400 12/12/16 1416  BP: 101/58 91/57 107/64   Pulse: 96 91 96   Resp: 24 24 21    Temp:    99.4 F (37.4 C)  TempSrc:    Rectal  SpO2: 96% 97% 98%   Weight:      Height:            Vitals:   12/12/16 1300 12/12/16 1330 12/12/16 1400 12/12/16 1416  BP: 101/58 91/57 107/64   Pulse: 96  91 96   Resp: 24 24 21    Temp:    99.4 F (37.4 C)  TempSrc:    Rectal  SpO2: 96% 97% 98%   Weight:      Height:       Constitutional: He appears well-developed. He appears lethargic. He appears ill.  HENT:  Head: Normocephalic and atraumatic.  Right Ear: External ear normal.  Left Ear: External ear normal.  Nose: Nose normal.  Mouth/Throat: Mucous membranes are dry.  Eyes: Right eye exhibits no discharge. Left eye exhibits no discharge.  Neck: Neck supple.  Cardiovascular: Regular rhythm and normal heart sounds.  Tachycardia present.   Pulmonary/Chest: Effort normal and breath sounds normal.  Abdominal: Soft. He exhibits no distension. There is no tenderness.  Musculoskeletal: He exhibits no edema.  Neurological: He  appears lethargic.  Patient is awake but does not speak. Does track with eyes when his name is called, occasionally shakes head. Moves all 4 extremities in resistance to testing or interventions  Skin: Skin is warm and dry. There is erythema.     Labs on Admission: I have personally reviewed following labs and imaging studies  CBC:  Recent Labs Lab 12/12/16 1100 12/12/16 1114  WBC 14.4*  --   NEUTROABS 12.9*  --   HGB 13.0 13.3  HCT 38.9* 39.0  MCV 94.0  --   PLT 284  --     Basic Metabolic Panel:  Recent Labs Lab 12/12/16 1100 12/12/16 1114  NA 140 142  K 3.9 3.8  CL 107 103  CO2 25  --   GLUCOSE 121* 121*  BUN 15 15  CREATININE 0.91 0.90  CALCIUM 8.8*  --   MG 1.5*  --     GFR: Estimated Creatinine Clearance: 71.8 mL/min (by C-G formula based on SCr of 0.9 mg/dL).  Liver Function Tests:  Recent Labs Lab 12/12/16 1100  AST 24  ALT 29  ALKPHOS 70  BILITOT 1.1  PROT 6.8  ALBUMIN 3.5   No results for input(s): LIPASE, AMYLASE in the last 168 hours. No results for input(s): AMMONIA in the last 168 hours.  Coagulation Profile:  Recent Labs Lab 12/12/16 1310  INR 1.26   No results for input(s): DDIMER in the last 72 hours.  Cardiac Enzymes: No results for input(s): CKTOTAL, CKMB, CKMBINDEX, TROPONINI in the last 168 hours.  BNP (last 3 results) No results for input(s): PROBNP in the last 8760 hours.  HbA1C: No results for input(s): HGBA1C in the last 72 hours. No results found for: HGBA1C   CBG:  Recent Labs Lab 12/12/16 1050  GLUCAP 117*    Lipid Profile: No results for input(s): CHOL, HDL, LDLCALC, TRIG, CHOLHDL, LDLDIRECT in the last 72 hours.  Thyroid Function Tests: No results for input(s): TSH, T4TOTAL, FREET4, T3FREE, THYROIDAB in the last 72 hours.  Anemia Panel: No results for input(s): VITAMINB12, FOLATE, FERRITIN, TIBC, IRON, RETICCTPCT in the last 72 hours.  Urine analysis:    Component Value Date/Time   COLORURINE  YELLOW 12/12/2016 1128   APPEARANCEUR CLEAR 12/12/2016 1128   LABSPEC 1.013 12/12/2016 1128   PHURINE 5.0 12/12/2016 1128   GLUCOSEU NEGATIVE 12/12/2016 1128   HGBUR MODERATE (A) 12/12/2016 1128   BILIRUBINUR NEGATIVE 12/12/2016 1128   KETONESUR 20 (A) 12/12/2016 1128   PROTEINUR NEGATIVE 12/12/2016 1128   UROBILINOGEN 0.2 08/11/2015 2108   NITRITE NEGATIVE 12/12/2016 1128   LEUKOCYTESUR NEGATIVE 12/12/2016 1128    Sepsis Labs: @LABRCNTIP (procalcitonin:4,lacticidven:4) )No results found for this  or any previous visit (from the past 240 hour(s)).       Radiological Exams on Admission: Ct Head Wo Contrast  Result Date: 12/12/2016 CLINICAL DATA:  Fever.  Demented.  Altered mental status. EXAM: CT HEAD WITHOUT CONTRAST TECHNIQUE: Contiguous axial images were obtained from the base of the skull through the vertex without intravenous contrast. COMPARISON:  MRI of 08/20/2015 (report not available). Head CT of 08/11/2015. FINDINGS: Brain: Mild low density in the periventricular white matter likely related to small vessel disease. This is slightly progressive. Cerebellar atrophy is advanced. No mass lesion, hemorrhage, hydrocephalus, acute infarct, intra-axial, or extra-axial fluid collection. Vascular: Intracranial atherosclerosis. Skull: Normal Sinuses/Orbits: Normal orbits and globes. Right maxillary sinus mucous retention cyst or polyp. Clear mastoid air cells. Other: None IMPRESSION: 1.  No acute intracranial abnormality. 2. Age advanced cerebellar atrophy with small vessel ischemic change. 3. Sinus disease. Electronically Signed   By: Abigail Miyamoto M.D.   On: 12/12/2016 12:25   Dg Chest Port 1 View  Result Date: 12/12/2016 CLINICAL DATA:  Cough.  Fever. EXAM: PORTABLE CHEST 1 VIEW COMPARISON:  08/11/2015 chest radiograph. FINDINGS: Stable cardiomediastinal silhouette with normal heart size. No pneumothorax. No pleural effusion. Patchy consolidation at the right lung base with faint air  bronchograms. No pulmonary edema. IMPRESSION: Patchy right lung base consolidation with faint air bronchograms, most compatible with pneumonia. Recommend follow-up PA and lateral post treatment chest radiographs in 4-6 weeks. Electronically Signed   By: Ilona Sorrel M.D.   On: 12/12/2016 11:44   Ct Head Wo Contrast  Result Date: 12/12/2016 CLINICAL DATA:  Fever.  Demented.  Altered mental status. EXAM: CT HEAD WITHOUT CONTRAST TECHNIQUE: Contiguous axial images were obtained from the base of the skull through the vertex without intravenous contrast. COMPARISON:  MRI of 08/20/2015 (report not available). Head CT of 08/11/2015. FINDINGS: Brain: Mild low density in the periventricular white matter likely related to small vessel disease. This is slightly progressive. Cerebellar atrophy is advanced. No mass lesion, hemorrhage, hydrocephalus, acute infarct, intra-axial, or extra-axial fluid collection. Vascular: Intracranial atherosclerosis. Skull: Normal Sinuses/Orbits: Normal orbits and globes. Right maxillary sinus mucous retention cyst or polyp. Clear mastoid air cells. Other: None IMPRESSION: 1.  No acute intracranial abnormality. 2. Age advanced cerebellar atrophy with small vessel ischemic change. 3. Sinus disease. Electronically Signed   By: Abigail Miyamoto M.D.   On: 12/12/2016 12:25   Dg Chest Port 1 View  Result Date: 12/12/2016 CLINICAL DATA:  Cough.  Fever. EXAM: PORTABLE CHEST 1 VIEW COMPARISON:  08/11/2015 chest radiograph. FINDINGS: Stable cardiomediastinal silhouette with normal heart size. No pneumothorax. No pleural effusion. Patchy consolidation at the right lung base with faint air bronchograms. No pulmonary edema. IMPRESSION: Patchy right lung base consolidation with faint air bronchograms, most compatible with pneumonia. Recommend follow-up PA and lateral post treatment chest radiographs in 4-6 weeks. Electronically Signed   By: Ilona Sorrel M.D.   On: 12/12/2016 11:44      EKG:  Independently reviewed.   Assessment/Plan Principal Problem:   Aspiration pneumonia (HCC)/community-acquired pneumonia Suspect component of aspiration given the location of the pneumonia Patient will be admitted to telemetry DNR/DNI confirmed Pneumonia focused order set Continue Rocephin azithromycin Supportive care with nebulizer treatments Speech therapy evaluation to rule out aspiration Continue antibiotics If no improvement consider palliative care consult  Decreased urine output Suspect dehydration due to poor oral intake Will hydrate with IV fluids Condom catheter for strict I's and O's    Dementia/Multiple sclerosis (HCC)-hold outpatient  medications until by mouth intake is established Hold Klonopin, Seroquel until by mouth intake is established Continue Exelon patch     DVT prophylaxis: *Lovenox     Code Status Orders DO NOT RESUSCITATE         Start     Ordered     Family Communication: Admission, patients condition and plan of care including tests being ordered have been discussed with the patient's nephew and caregiver  who indicates understanding and agree with the plan and Code Status  Admission status: inpatient    Disposition plan: Further plan will depend as patient's clinical course evolves and further radiologic and laboratory data become available. Likely home when stable   At the time of admission, it appears that the appropriate admission status for this patient is INPATIENT .Thisis judged to be reasonable and necessary in order to provide the required intensity of service to ensure the patient's safetygiven thepresenting symptoms, physical exam findings, and initial radiographic and laboratory data in the context of their chronic comorbidities.   Reyne Dumas MD [facility] Pager [phone]  If 7PM-7AM, please contact night-coverage [email or web address] Password [email or web address]  12/12/2016, 2:17 PM

## 2016-12-12 NOTE — ED Triage Notes (Signed)
Per EMS- Patient lives at home alone with caretakers. Fever reported today with fever 104.0 per caretaker. Patient is bedridden and has dementia. Caretaker reported that patient is normally verbal, but is not now. Room air sats 91% prior to O2 4L/min via [place].

## 2016-12-12 NOTE — ED Notes (Signed)
Bed: [id number] Expected date:  Expected time:  Means of arrival:  Comments: EMS 71yo orthostatic

## 2016-12-12 NOTE — ED Notes (Signed)
In and out cath completed by Tivis Ringer , RN and witnessed by Beola Cord., RN

## 2016-12-12 NOTE — Progress Notes (Signed)
Pharmacy Antibiotic Note  Luis Shaffer is a 71 y.o. male presented to the ED today with c/o fever and AMS. Code sepsis was activated on admission.  To start azithromycin and ceftriaxone for CAP.  Plan: - Ceftriaxone 1 gm IV q24h - azithromycin 500 mg IV q24h - No renal adjustment is needed with Ceftriaxone/azithromycin -- pharmacy will sign off - Re-consult [place] if need further assistance  Thank you for asking pharmacy to be part of this patient's care.   ___________________________  Height: 5\' 8"  (172.7 cm) Weight: 170 lb (77.1 kg) IBW/kg (Calculated) : 68.4  Temp (24hrs), Avg:102.4 F (39.1 C), Min:102.4 F (39.1 C), Max:102.4 F (39.1 C)   Recent Labs Lab 12/12/16 1100 12/12/16 1114  WBC 14.4*  --   CREATININE 0.91 0.90  LATICACIDVEN  --  1.19    Estimated Creatinine Clearance: 71.8 mL/min (by C-G formula based on SCr of 0.9 mg/dL).    No Known Allergies  Thank you for allowing pharmacy to be a part of this patient's care.  Lynelle Doctor 12/12/2016 11:53 AM

## 2016-12-12 NOTE — ED Provider Notes (Signed)
[facility] DEPT Provider Note   CSN: [id number] Arrival date & time: 12/12/16  1014   LEVEL 5 CAVEAT - ALTERED MENTAL STATUS  History   Chief Complaint Chief Complaint  Patient presents with  . Fever  . lethargic    HPI Luis Shaffer is a 71 y.o. male.  HPI  71 year old male with a history of multiple sclerosis and dementia presents with fever and altered mental status. History is taken from the caregiver at the bedside. He had a fever of 104 this morning. She reports that over the last 2 weeks he has eaten less and had less urine output and bowel movements. However he has seemed to be at his baseline mental status which has baseline confusion but he can talk to you and carry a conversation. He's been having a cough for about 4 or 5 days. Last night during a coughing spell he vomited according to another caregiver. This morning when she went to get him up out of bed he remained lethargic and could not get out of bed. He has not been talking to her. He does not wear oxygen, was found to have O2 sats of 85% on arrival to the ER.  Past Medical History:  Diagnosis Date  . Dementia   . Hernia   . High blood pressure   . Multiple sclerosis ([place])   . Paresthesia     Patient Active Problem List   Diagnosis Date Noted  . Sepsis ([place]) 12/12/2016  . Dementia 12/12/2016  . CAP (community acquired pneumonia) 12/12/2016  . Aspiration pneumonia ([place]) 12/12/2016  . Abnormality of gait 08/24/2015  . Lewy body dementia 08/24/2015  . Multiple sclerosis ([place])   . Paresthesia   . Hernia   . Thyroid disorder     Past Surgical History:  Procedure Laterality Date  . GALLBLADDER SURGERY  2003       Home Medications    Prior to Admission medications   Medication Sig Start Date End Date Taking? Authorizing Provider  clonazePAM (KLONOPIN) 1 MG tablet Take 1 tablet (1 mg total) by mouth at bedtime. 08/09/15  Yes Marcial Pacas, MD  polyethylene glycol (MIRALAX / GLYCOLAX) packet Take  17 g by mouth every morning.   Yes Historical Provider, MD  QUEtiapine (SEROQUEL) 25 MG tablet Take 25 mg by mouth at bedtime.   Yes Historical Provider, MD  VESICARE 5 MG tablet Take 5 mg by mouth daily at 12 noon.  07/30/15  Yes Historical Provider, MD  rivastigmine (EXELON) 4.6 mg/24hr Place 1 patch (4.6 mg total) onto the skin daily. Patient not taking: Reported on 12/12/2016 08/24/15   Marcial Pacas, MD  rivastigmine (EXELON) 9.5 mg/24hr Place 1 patch (9.5 mg total) onto the skin daily. Patient not taking: Reported on 12/12/2016 08/24/15   Marcial Pacas, MD    Family History Family History  Problem Relation Age of Onset  . Breast cancer Mother   . Bone cancer Mother   . Kidney failure Father     Social History Social History  Substance Use Topics  . Smoking status: Former Smoker    Types: Cigarettes  . Smokeless tobacco: Never Used     Comment: Quit 40 years ago  . Alcohol use No     Allergies   Patient has no known allergies.   Review of Systems Review of Systems  Unable to perform ROS: Mental status change     Physical Exam Updated Vital Signs BP 106/66   Pulse 96   Temp  99.4 F (37.4 C) (Rectal)   Resp 17   Ht 5\' 8"  (1.727 m)   Wt 170 lb (77.1 kg)   SpO2 97%   BMI 25.85 kg/m   Physical Exam  Constitutional: He appears well-developed. He appears lethargic. He appears ill.  HENT:  Head: Normocephalic and atraumatic.  Right Ear: External ear normal.  Left Ear: External ear normal.  Nose: Nose normal.  Mouth/Throat: Mucous membranes are dry.  Eyes: Right eye exhibits no discharge. Left eye exhibits no discharge.  Neck: Neck supple.  Cardiovascular: Regular rhythm and normal heart sounds.  Tachycardia present.   Pulmonary/Chest: Effort normal and breath sounds normal.  Abdominal: Soft. He exhibits no distension. There is no tenderness.  Musculoskeletal: He exhibits no edema.  Neurological: He appears lethargic.  Patient is awake but does not speak. Does track  with eyes when his name is called, occasionally shakes head. Moves all 4 extremities in resistance to testing or interventions  Skin: Skin is warm and dry. There is erythema.     Nursing note and vitals reviewed.    ED Treatments / Results  Labs (all labs ordered are listed, but only abnormal results are displayed) Labs Reviewed  COMPREHENSIVE METABOLIC PANEL - Abnormal; Notable for the following:       Result Value   Glucose, Bld 121 (*)    Calcium 8.8 (*)    All other components within normal limits  CBC WITH DIFFERENTIAL/PLATELET - Abnormal; Notable for the following:    WBC 14.4 (*)    RBC 4.14 (*)    HCT 38.9 (*)    Neutro Abs 12.9 (*)    Lymphs Abs 0.4 (*)    Monocytes Absolute 1.2 (*)    All other components within normal limits  URINALYSIS, ROUTINE W REFLEX MICROSCOPIC - Abnormal; Notable for the following:    Hgb urine dipstick MODERATE (*)    Ketones, ur 20 (*)    Squamous Epithelial / LPF 0-5 (*)    All other components within normal limits  LACTIC ACID, PLASMA - Abnormal; Notable for the following:    Lactic Acid, Venous 2.0 (*)    All other components within normal limits  PROTIME-INR - Abnormal; Notable for the following:    Prothrombin Time 15.9 (*)    All other components within normal limits  MAGNESIUM - Abnormal; Notable for the following:    Magnesium 1.5 (*)    All other components within normal limits  CBG MONITORING, ED - Abnormal; Notable for the following:    Glucose-Capillary 117 (*)    All other components within normal limits  I-STAT CHEM 8, ED - Abnormal; Notable for the following:    Glucose, Bld 121 (*)    Calcium, Ion 1.09 (*)    All other components within normal limits  CULTURE, BLOOD (ROUTINE X 2)  CULTURE, BLOOD (ROUTINE X 2)  GRAM STAIN  CULTURE, EXPECTORATED SPUTUM-ASSESSMENT  RESPIRATORY PANEL BY PCR  LACTIC ACID, PLASMA  PROCALCITONIN  APTT  URINALYSIS, ROUTINE W REFLEX MICROSCOPIC  HIV ANTIBODY (ROUTINE TESTING)  STREP  PNEUMONIAE URINARY ANTIGEN  LEGIONELLA PNEUMOPHILA SEROGP 1 UR AG  I-STAT CG4 LACTIC ACID, ED  I-STAT CG4 LACTIC ACID, ED    EKG  EKG Interpretation  Date/Time:  Tuesday December 12 2016 10:33:17 EST Ventricular Rate:  115 PR Interval:    QRS Duration: 115 QT Interval:  326 QTC Calculation: 451 R Axis:   88 Text Interpretation:  Sinus tachycardia Incomplete right bundle branch block Interpretation  limited secondary to artifact otherwise similar to nov 2016 Confirmed by Regenia Skeeter MD, Adaira Centola [id number]) on 12/12/2016 11:11:02 AM Also confirmed by Regenia Skeeter MD, Latash Nouri [phone]), editor [place] CT, Leda Gauze [phone])  on 12/12/2016 11:12:37 AM       Radiology Ct Head Wo Contrast  Result Date: 12/12/2016 CLINICAL DATA:  Fever.  Demented.  Altered mental status. EXAM: CT HEAD WITHOUT CONTRAST TECHNIQUE: Contiguous axial images were obtained from the base of the skull through the vertex without intravenous contrast. COMPARISON:  MRI of 08/20/2015 (report not available). Head CT of 08/11/2015. FINDINGS: Brain: Mild low density in the periventricular white matter likely related to small vessel disease. This is slightly progressive. Cerebellar atrophy is advanced. No mass lesion, hemorrhage, hydrocephalus, acute infarct, intra-axial, or extra-axial fluid collection. Vascular: Intracranial atherosclerosis. Skull: Normal Sinuses/Orbits: Normal orbits and globes. Right maxillary sinus mucous retention cyst or polyp. Clear mastoid air cells. Other: None IMPRESSION: 1.  No acute intracranial abnormality. 2. Age advanced cerebellar atrophy with small vessel ischemic change. 3. Sinus disease. Electronically Signed   By: Abigail Miyamoto M.D.   On: 12/12/2016 12:25   Dg Chest Port 1 View  Result Date: 12/12/2016 CLINICAL DATA:  Cough.  Fever. EXAM: PORTABLE CHEST 1 VIEW COMPARISON:  08/11/2015 chest radiograph. FINDINGS: Stable cardiomediastinal silhouette with normal heart size. No pneumothorax. No pleural effusion. Patchy  consolidation at the right lung base with faint air bronchograms. No pulmonary edema. IMPRESSION: Patchy right lung base consolidation with faint air bronchograms, most compatible with pneumonia. Recommend follow-up PA and lateral post treatment chest radiographs in 4-6 weeks. Electronically Signed   By: Ilona Sorrel M.D.   On: 12/12/2016 11:44    Procedures Procedures (including critical care time)  Medications Ordered in ED Medications  azithromycin (ZITHROMAX) 500 mg in dextrose 5 % 250 mL IVPB (not administered)  cefTRIAXone (ROCEPHIN) 1 g in dextrose 5 % 50 mL IVPB (not administered)  enoxaparin (LOVENOX) injection 40 mg (not administered)  sodium chloride flush (NS) 0.9 % injection 3 mL (not administered)  0.9 %  sodium chloride infusion ( Intravenous New Bag/Given 12/12/16 1414)  acetaminophen (TYLENOL) tablet 650 mg (not administered)    Or  acetaminophen (TYLENOL) suppository 650 mg (not administered)  ondansetron (ZOFRAN) tablet 4 mg (not administered)    Or  ondansetron (ZOFRAN) injection 4 mg (not administered)  levalbuterol (XOPENEX) nebulizer solution 0.63 mg (not administered)  sodium chloride 0.9 % bolus 1,000 mL (0 mLs Intravenous Stopped 12/12/16 1534)  acetaminophen (TYLENOL) suppository 650 mg (650 mg Rectal Given 12/12/16 1148)  cefTRIAXone (ROCEPHIN) 1 g in dextrose 5 % 50 mL IVPB (0 g Intravenous Stopped 12/12/16 1534)  azithromycin (ZITHROMAX) 500 mg in dextrose 5 % 250 mL IVPB (0 mg Intravenous Stopped 12/12/16 1534)  sodium chloride 0.9 % bolus 500 mL (500 mLs Intravenous New Bag/Given 12/12/16 1630)     Initial Impression / Assessment and Plan / ED Course  I have reviewed the triage vital signs and the nursing notes.  Pertinent labs & imaging results that were available during my care of the patient were reviewed by me and considered in my medical decision making (see chart for details).  Clinical Course as of Dec 12 1641  Tue Dec 12, 2016  1054 Will activate code  sepsis given fever, hypoxia, tachycardia, and change in mental status.  [SG]  1133 Xray looks like there's a possible RLL PNA on portable view. Will start rocephin/azithro  [SG]  1243 Dr Allyson Sabal to admit.  [SG]  Clinical Course User Index [SG] Sherwood Gambler, MD   Patient's mental status has improved significantly. Will treat for PNA. Nephew is at bedside, is POA, asks for IV antibiotics and if proving futile will likely make comfort care. DNR/DNI, no heroic measures  Final Clinical Impressions(s) / ED Diagnoses   Final diagnoses:  Community acquired pneumonia of right lower lobe of lung [facility])    New Prescriptions New Prescriptions   No medications on file     Sherwood Gambler, MD 12/12/16 1644

## 2016-12-12 NOTE — Plan of Care (Signed)
Problem: Education: Goal: Knowledge of [facility] Education information/materials will improve Outcome: Completed/Met Date Met: 12/12/16 Discussed with bedside caregivers

## 2016-12-12 NOTE — Plan of Care (Signed)
Problem: Safety: Goal: Ability to remain free from injury will improve Outcome: Completed/Met Date Met: 12/12/16 Discussed with bedside caregivers

## 2016-12-13 DIAGNOSIS — Z7401 Bed confinement status: Secondary | ICD-10-CM

## 2016-12-13 DIAGNOSIS — R627 Adult failure to thrive: Secondary | ICD-10-CM

## 2016-12-13 DIAGNOSIS — F0150 Vascular dementia without behavioral disturbance: Secondary | ICD-10-CM

## 2016-12-13 DIAGNOSIS — G35 Multiple sclerosis: Secondary | ICD-10-CM

## 2016-12-13 DIAGNOSIS — J690 Pneumonitis due to inhalation of food and vomit: Secondary | ICD-10-CM

## 2016-12-13 LAB — URINALYSIS, ROUTINE W REFLEX MICROSCOPIC
BACTERIA UA: NONE SEEN
Bilirubin Urine: NEGATIVE
Glucose, UA: NEGATIVE mg/dL
KETONES UR: 20 mg/dL — AB
Leukocytes, UA: NEGATIVE
Nitrite: NEGATIVE
PROTEIN: NEGATIVE mg/dL
Specific Gravity, Urine: 1.021 (ref 1.005–1.030)
pH: 5 (ref 5.0–8.0)

## 2016-12-13 LAB — RESPIRATORY PANEL BY PCR
ADENOVIRUS-RVPPCR: NOT DETECTED
BORDETELLA PERTUSSIS-RVPCR: NOT DETECTED
CHLAMYDOPHILA PNEUMONIAE-RVPPCR: NOT DETECTED
CORONAVIRUS HKU1-RVPPCR: NOT DETECTED
CORONAVIRUS NL63-RVPPCR: NOT DETECTED
Coronavirus 229E: NOT DETECTED
Coronavirus OC43: NOT DETECTED
Influenza A: NOT DETECTED
Influenza B: NOT DETECTED
MYCOPLASMA PNEUMONIAE-RVPPCR: NOT DETECTED
Metapneumovirus: NOT DETECTED
Parainfluenza Virus 1: NOT DETECTED
Parainfluenza Virus 2: NOT DETECTED
Parainfluenza Virus 3: NOT DETECTED
Parainfluenza Virus 4: NOT DETECTED
Respiratory Syncytial Virus: NOT DETECTED
Rhinovirus / Enterovirus: NOT DETECTED

## 2016-12-13 LAB — HIV ANTIBODY (ROUTINE TESTING W REFLEX): HIV SCREEN 4TH GENERATION: NONREACTIVE

## 2016-12-13 LAB — STREP PNEUMONIAE URINARY ANTIGEN: Strep Pneumo Urinary Antigen: NEGATIVE

## 2016-12-13 NOTE — Evaluation (Signed)
SLP Cancellation Note  Patient Details Name: Rand Etchison MRN: [id number] DOB: [birth year]   Cancelled treatment:       Reason Eval/Treat Not Completed: Medical issues which prohibited therapy;Fatigue/lethargy limiting ability to participate (spoke to RN who reports pt is not appropriate for po at this time due to lethargy, will follow up next date)   Claudie Fisherman, [place] [facility] SLP [phone]

## 2016-12-13 NOTE — Progress Notes (Signed)
PROGRESS NOTE  Luis Shaffer EYC:[id number] DOB: [birth year] DOA: 12/12/2016 PCP: [facility]  HPI/Recap of past 24 hours:  Frail, follow command inconsistantly, personal care giver at bedside  Assessment/Plan: Principal Problem:   Aspiration pneumonia ([place]) Active Problems:   Multiple sclerosis ([place])   Sepsis ([place])   Dementia   CAP (community acquired pneumonia)   Aspiration pneumonia (HCC)/community-acquired pneumonia Suspect component of aspiration given the location of the pneumonia Patient will be admitted to telemetry DNR/DNI confirmed Pneumonia focused order set Continue Rocephin azithromycin Supportive care with nebulizer treatments Speech therapy evaluation to rule out aspiration Continue antibiotics If no improvement consider palliative care consult  Decreased urine output Suspect dehydration due to poor oral intake Will hydrate with IV fluids Condom catheter for strict I's and O's    Dementia/Multiple sclerosis (HCC)-hold outpatient medications until by mouth intake is established Hold Klonopin, Seroquel until by mouth intake is established Continue Exelon patch Per personal care giver, patient has been bedridden at baseline, last neurology follow up was in 2016, patient was put on hospice and discharged from hospice a while ago.     DVT prophylaxis: Lovenox   Code Status: DNR  Family Communication: patient and personal caregiver at bedside  Disposition Plan: pending   Consultants:  none  Procedures:  none  Antibiotics:  Rocephin/zithro   Objective: BP (!) 119/103 (BP Location: Right Arm) Comment: patient very tense   Pulse 91   Temp 98.3 F (36.8 C) (Axillary)   Resp 18   Ht 5\' 10"  (1.778 m)   Wt 62.2 kg (137 lb 2 oz)   SpO2 93%   BMI 19.68 kg/m   Intake/Output Summary (Last 24 hours) at 12/13/16 1753 Last data filed at 12/13/16 1000  Gross per 24 hour  Intake          1576.67 ml  Output              375  ml  Net          1201.67 ml   Filed Weights   12/12/16 1039 12/12/16 1712  Weight: 77.1 kg (170 lb) 62.2 kg (137 lb 2 oz)    Exam:   General:  Frail, chronically ill, follow commands inconsistantly  Cardiovascular: RRR  Respiratory: CTABL  Abdomen: Soft/ND/NT, positive BS  Musculoskeletal: No Edema  Neuro: patient is awake, does not speak,  follow commands inconsistantly   Data Reviewed: Basic Metabolic Panel:  Recent Labs Lab 12/12/16 1100 12/12/16 1114  NA 140 142  K 3.9 3.8  CL 107 103  CO2 25  --   GLUCOSE 121* 121*  BUN 15 15  CREATININE 0.91 0.90  CALCIUM 8.8*  --   MG 1.5*  --    Liver Function Tests:  Recent Labs Lab 12/12/16 1100  AST 24  ALT 29  ALKPHOS 70  BILITOT 1.1  PROT 6.8  ALBUMIN 3.5   No results for input(s): LIPASE, AMYLASE in the last 168 hours. No results for input(s): AMMONIA in the last 168 hours. CBC:  Recent Labs Lab 12/12/16 1100 12/12/16 1114  WBC 14.4*  --   NEUTROABS 12.9*  --   HGB 13.0 13.3  HCT 38.9* 39.0  MCV 94.0  --   PLT 284  --    Cardiac Enzymes:   No results for input(s): CKTOTAL, CKMB, CKMBINDEX, TROPONINI in the last 168 hours. BNP (last 3 results) No results for input(s): BNP in the last 8760 hours.  ProBNP (last 3 results)  No results for input(s): PROBNP in the last 8760 hours.  CBG:  Recent Labs Lab 12/12/16 1050  GLUCAP 117*    Recent Results (from the past 240 hour(s))  Blood Culture (routine x 2)     Status: None (Preliminary result)   Collection Time: 12/12/16 11:01 AM  Result Value Ref [place] Status   Specimen Description BLOOD LEFT WRIST  Final   Special Requests BOTTLES DRAWN AEROBIC AND ANAEROBIC 5ML  Final   Culture   Final    NO GROWTH < 24 HOURS Performed at [facility], [place] [address], [place], [place] [address]    Report Status PENDING  Incomplete  Blood Culture (routine x 2)     Status: None (Preliminary result)   Collection Time: 12/12/16 11:01 AM  Result  Value Ref Range Status   Specimen Description BLOOD RIGHT FOREARM  Final   Special Requests BOTTLES DRAWN AEROBIC AND ANAEROBIC 5ML  Final   Culture   Final    NO GROWTH < 24 HOURS Performed at [facility], [place] [address] [place], [place] [address]    Report Status PENDING  Incomplete  Respiratory Panel by PCR     Status: None   Collection Time: 12/12/16  3:31 PM  Result Value Ref [place] Status   Adenovirus NOT DETECTED NOT DETECTED Final   Coronavirus 229E NOT DETECTED NOT DETECTED Final   Coronavirus HKU1 NOT DETECTED NOT DETECTED Final   Coronavirus NL63 NOT DETECTED NOT DETECTED Final   Coronavirus OC43 NOT DETECTED NOT DETECTED Final   Metapneumovirus NOT DETECTED NOT DETECTED Final   Rhinovirus / Enterovirus NOT DETECTED NOT DETECTED Final   Influenza A NOT DETECTED NOT DETECTED Final   Influenza B NOT DETECTED NOT DETECTED Final   Parainfluenza Virus 1 NOT DETECTED NOT DETECTED Final   Parainfluenza Virus 2 NOT DETECTED NOT DETECTED Final   Parainfluenza Virus 3 NOT DETECTED NOT DETECTED Final   Parainfluenza Virus 4 NOT DETECTED NOT DETECTED Final   Respiratory Syncytial Virus NOT DETECTED NOT DETECTED Final   Bordetella pertussis NOT DETECTED NOT DETECTED Final   Chlamydophila pneumoniae NOT DETECTED NOT DETECTED Final   Mycoplasma pneumoniae NOT DETECTED NOT DETECTED Final    Comment: Performed at [facility], [place] [address] [place], [place] [address]     Studies: No results found.  Scheduled Meds: . azithromycin  500 mg Intravenous Q24H  . cefTRIAXone (ROCEPHIN)  IV  1 g Intravenous Q24H  . clonazePAM  1 mg Oral QHS  . darifenacin  7.5 mg Oral Daily  . enoxaparin (LOVENOX) injection  40 mg Subcutaneous Q24H  . mouth rinse  15 mL Mouth Rinse BID  . polyethylene glycol  17 g Oral BH-q7a  . QUEtiapine  25 mg Oral QHS  . rivastigmine  4.6 mg Transdermal Daily  . sodium chloride flush  3 mL Intravenous Q12H    Continuous Infusions: . sodium  chloride 100 mL/hr at 12/12/16 2319     Time spent: 6mins  Alitzel Cookson MD, PhD  [facility] Pager [phone]. If 7PM-7AM, please contact night-coverage at [email or web address], password [facility] 12/13/2016, 5:53 PM  LOS: 1 day

## 2016-12-13 NOTE — Progress Notes (Signed)
Initial Nutrition Assessment  DOCUMENTATION CODES:   Not applicable  INTERVENTION:   RD will order supplements when diet advanced    NUTRITION DIAGNOSIS:   Inadequate oral intake related to inability to eat as evidenced by NPO status, severe depletion of muscle mass.  GOAL:   Patient will meet greater than or equal to 90% of their needs  MONITOR:   Diet advancement, Labs, Weight trends, Skin  REASON FOR ASSESSMENT:   Malnutrition Screening Tool    ASSESSMENT:   71 year old male with a history of [name], relapsing remitting multiple sclerosis since 1998, followed by [facility], who was brought in today by caregiver and nephew who is power of attorney , for rapid decline, gait instability, poor oral intake, lethargy, altered mental status. Admitted for possible aspiration PNA    Visited pt's room today. Pt unable to communicate. Caregiver at bedside. Pt currently NPO awaiting SLP evaluation. Per chart, pt has lost 34lbs(20%) in one year. This is significant. RD will order supplements when diet advanced.   Medications reviewed and include: azithromycin, ceftriaxone, lovenox, miralax  Labs reviewed: iCa 1.09(L), lactic acid 2.0(H) Wbc- 14.4(H)  Nutrition-Focused physical exam completed. Findings are fat depletion, severe muscle depletion, and no edema.   Diet Order:  Diet NPO time specified  Skin:  Reviewed, no issues  Last BM:  none since admit   Height:   Ht Readings from Last 1 Encounters:  12/12/16 5\' 10"  (1.778 m)    Weight:   Wt Readings from Last 1 Encounters:  12/12/16 137 lb 2 oz (62.2 kg)    Ideal Body Weight:     BMI:  Body mass index is 19.68 kg/m.  Estimated Nutritional Needs:   Kcal:  1750-2050kcal/day   Protein:  87-100g/day   Fluid:  >1.7L/day   EDUCATION NEEDS:   Education needs no appropriate at this time  Koleen Distance, RD, [place] Pager #[phone]

## 2016-12-13 NOTE — Progress Notes (Signed)
Pt has 24 hour care with [facility]. Will continue to follow for dc needs.

## 2016-12-14 DIAGNOSIS — J181 Lobar pneumonia, unspecified organism: Secondary | ICD-10-CM

## 2016-12-14 DIAGNOSIS — J9601 Acute respiratory failure with hypoxia: Secondary | ICD-10-CM

## 2016-12-14 LAB — CBC WITH DIFFERENTIAL/PLATELET
BASOS ABS: 0 10*3/uL (ref 0.0–0.1)
Basophils Relative: 0 %
Eosinophils Absolute: 0.1 10*3/uL (ref 0.0–0.7)
Eosinophils Relative: 1 %
HEMATOCRIT: 33.3 % — AB (ref 39.0–52.0)
Hemoglobin: 10.9 g/dL — ABNORMAL LOW (ref 13.0–17.0)
LYMPHS ABS: 1.1 10*3/uL (ref 0.7–4.0)
LYMPHS PCT: 14 %
MCH: 30.3 pg (ref 26.0–34.0)
MCHC: 32.7 g/dL (ref 30.0–36.0)
MCV: 92.5 fL (ref 78.0–100.0)
Monocytes Absolute: 0.7 10*3/uL (ref 0.1–1.0)
Monocytes Relative: 9 %
NEUTROS PCT: 76 %
Neutro Abs: 5.9 10*3/uL (ref 1.7–7.7)
Platelets: 279 10*3/uL (ref 150–400)
RBC: 3.6 MIL/uL — AB (ref 4.22–5.81)
RDW: 12.7 % (ref 11.5–15.5)
WBC: 7.8 10*3/uL (ref 4.0–10.5)

## 2016-12-14 LAB — BASIC METABOLIC PANEL
ANION GAP: 8 (ref 5–15)
BUN: 10 mg/dL (ref 6–20)
CALCIUM: 8.1 mg/dL — AB (ref 8.9–10.3)
CO2: 24 mmol/L (ref 22–32)
Chloride: 110 mmol/L (ref 101–111)
Creatinine, Ser: 0.74 mg/dL (ref 0.61–1.24)
GFR calc Af Amer: >60 mL/min (ref >60)
GFR calc non Af Amer: >60 mL/min (ref >60)
GLUCOSE: 75 mg/dL (ref 65–99)
POTASSIUM: 3.4 mmol/L — AB (ref 3.5–5.1)
Sodium: 142 mmol/L (ref 135–145)

## 2016-12-14 LAB — MAGNESIUM: Magnesium: 1.5 mg/dL — ABNORMAL LOW (ref 1.7–2.4)

## 2016-12-14 MED ORDER — SODIUM CHLORIDE 0.9 % IV SOLN
3.0000 g | Freq: Four times a day (QID) | INTRAVENOUS | Status: DC
  Administered 2016-12-15 – 2016-12-18 (×13): 3 g via INTRAVENOUS
  Filled 2016-12-14 (×16): qty 3

## 2016-12-14 MED ORDER — SODIUM CHLORIDE 0.9 % IV SOLN
1.5000 g | Freq: Four times a day (QID) | INTRAVENOUS | Status: DC
  Administered 2016-12-14: 1.5 g via INTRAVENOUS
  Filled 2016-12-14 (×3): qty 1.5

## 2016-12-14 MED ORDER — SODIUM CHLORIDE 0.9 % IV BOLUS (SEPSIS)
500.0000 mL | Freq: Once | INTRAVENOUS | Status: AC
  Administered 2016-12-14: 500 mL via INTRAVENOUS

## 2016-12-14 MED ORDER — MAGNESIUM SULFATE 2 GM/50ML IV SOLN
2.0000 g | Freq: Once | INTRAVENOUS | Status: AC
  Administered 2016-12-14: 2 g via INTRAVENOUS
  Filled 2016-12-14: qty 50

## 2016-12-14 MED ORDER — SODIUM CHLORIDE 0.9 % IV SOLN
600.0000 mg | Freq: Once | INTRAVENOUS | Status: AC
  Administered 2016-12-14: 600 mg via INTRAVENOUS
  Filled 2016-12-14: qty 6

## 2016-12-14 NOTE — Evaluation (Signed)
SLP Cancellation Note  Patient Details Name: Luis Shaffer MRN: [id number] DOB: 1946-05-16   Cancelled treatment:       Reason Eval/Treat Not Completed: Medical issues which prohibited therapy;Fatigue/lethargy limiting ability to participate (pt sleeping with open mouth posture, coughing on secretions which he is unable to clear, advised caregiver and nephew to secretion aspiration at this time, recommend oral care/moisture)   Luanna Salk, [place] [facility] [place] [phone]

## 2016-12-14 NOTE — Progress Notes (Addendum)
PROGRESS NOTE  Luis Shaffer WNU:[id number] DOB: August 26, 1946 DOA: 12/12/2016 PCP: [facility]  HPI/Recap of past 24 hours:  Frail, follow command inconsistantly, personal care giver and HCPOA  at bedside   Assessment/Plan: Principal Problem:   Aspiration pneumonia ([name]) Active Problems:   Multiple sclerosis ([name])   Sepsis ([place])   Dementia   CAP (community acquired pneumonia)   Aspiration pneumonia (HCC)/community-acquired pneumonia/hypoxic respiratory failure o2 sats 85 on room air on presentation Cxr: Patchy right lung base consolidation with faint air bronchograms, most compatible with pneumonia. Recommend follow-up PA and lateral post treatment chest radiographs in 4-6 weeks Suspect component of aspiration given the location of the pneumonia He is started on Rocephin azithromycin, urine strep antigen negative, will change abx to unasyn to better cover aspiration pneumonia Supportive care with nebulizer treatments Speech therapy evaluation to rule out aspiration, currently remain npo,  If no improvement consider palliative care consult, family aware. ( per family patient has been on regular diet, but they noticed difficulty in the last two weeks)  Decreased urine output Suspect dehydration due to poor oral intake Continue IV fluids Condom catheter for strict I's and O's   Dementia/Multiple sclerosis (HCC)-hold outpatient medications until by mouth intake is established Hold Klonopin, Seroquel until by mouth intake is established Family report patient was taken off  Exelon patch last year Per personal care giver, patient has been bedridden at baseline, last neurology follow up was in 2016, patient was put on hospice and discharged from hospice a while ago. If no improvement, will need to consult palliative care     DVT prophylaxis: Lovenox   Code Status: DNR  Family Communication: patient, personal caregiver and HCPOA at bedside  Disposition  Plan: pending   Consultants:  none  Procedures:  none  Antibiotics:  Rocephin/zithro from admission to 3/8  unasyn from 3/8 to    Objective: BP 120/60 (BP Location: Right Arm)   Pulse 87   Temp 99.6 F (37.6 C) (Axillary)   Resp 20   Ht 5\' 10"  (1.778 m)   Wt 62.2 kg (137 lb 2 oz)   SpO2 93%   BMI 19.68 kg/m   Intake/Output Summary (Last 24 hours) at 12/14/16 1431 Last data filed at 12/14/16 0818  Gross per 24 hour  Intake             2700 ml  Output              550 ml  Net             2150 ml   Filed Weights   12/12/16 1039 12/12/16 1712  Weight: 77.1 kg (170 lb) 62.2 kg (137 lb 2 oz)    Exam:   General:  Frail, chronically ill, follow commands inconsistantly  Cardiovascular: RRR  Respiratory: CTABL  Abdomen: Soft/ND/NT, positive BS  Musculoskeletal: No Edema  Neuro: patient is awake, does not speak,  follow commands inconsistantly   Data Reviewed: Basic Metabolic Panel:  Recent Labs Lab 12/12/16 1100 12/12/16 1114 12/14/16 0708  NA 140 142 142  K 3.9 3.8 3.4*  CL 107 103 110  CO2 25  --  24  GLUCOSE 121* 121* 75  BUN 15 15 10   CREATININE 0.91 0.90 0.74  CALCIUM 8.8*  --  8.1*  MG 1.5*  --  1.5*   Liver Function Tests:  Recent Labs Lab 12/12/16 1100  AST 24  ALT 29  ALKPHOS 70  BILITOT 1.1  PROT 6.8  ALBUMIN 3.5   No results for input(s): LIPASE, AMYLASE in the last 168 hours. No results for input(s): AMMONIA in the last 168 hours. CBC:  Recent Labs Lab 12/12/16 1100 12/12/16 1114 12/14/16 0708  WBC 14.4*  --  7.8  NEUTROABS 12.9*  --  5.9  HGB 13.0 13.3 10.9*  HCT 38.9* 39.0 33.3*  MCV 94.0  --  92.5  PLT 284  --  279   Cardiac Enzymes:   No results for input(s): CKTOTAL, CKMB, CKMBINDEX, TROPONINI in the last 168 hours. BNP (last 3 results) No results for input(s): BNP in the last 8760 hours.  ProBNP (last 3 results) No results for input(s): PROBNP in the last 8760 hours.  CBG:  Recent Labs Lab  12/12/16 1050  GLUCAP 117*    Recent Results (from the past 240 hour(s))  Blood Culture (routine x 2)     Status: None (Preliminary result)   Collection Time: 12/12/16 11:01 AM  Result Value Ref Range Status   Specimen Description BLOOD LEFT WRIST  Final   Special Requests BOTTLES DRAWN AEROBIC AND ANAEROBIC 5ML  Final   Culture   Final    NO GROWTH < 24 HOURS Performed at [facility], [place] [address], [place], [place] [address]    Report Status PENDING  Incomplete  Blood Culture (routine x 2)     Status: None (Preliminary result)   Collection Time: 12/12/16 11:01 AM  Result Value Ref Range Status   Specimen Description BLOOD RIGHT FOREARM  Final   Special Requests BOTTLES DRAWN AEROBIC AND ANAEROBIC 5ML  Final   Culture   Final    NO GROWTH < 24 HOURS Performed at [facility], [place] [address], [place], [place] [address]    Report Status PENDING  Incomplete  Respiratory Panel by PCR     Status: None   Collection Time: 12/12/16  3:31 PM  Result Value Ref Range Status   Adenovirus NOT DETECTED NOT DETECTED Final   Coronavirus 229E NOT DETECTED NOT DETECTED Final   Coronavirus HKU1 NOT DETECTED NOT DETECTED Final   Coronavirus NL63 NOT DETECTED NOT DETECTED Final   Coronavirus OC43 NOT DETECTED NOT DETECTED Final   Metapneumovirus NOT DETECTED NOT DETECTED Final   Rhinovirus / Enterovirus NOT DETECTED NOT DETECTED Final   Influenza A NOT DETECTED NOT DETECTED Final   Influenza B NOT DETECTED NOT DETECTED Final   Parainfluenza Virus 1 NOT DETECTED NOT DETECTED Final   Parainfluenza Virus 2 NOT DETECTED NOT DETECTED Final   Parainfluenza Virus 3 NOT DETECTED NOT DETECTED Final   Parainfluenza Virus 4 NOT DETECTED NOT DETECTED Final   Respiratory Syncytial Virus NOT DETECTED NOT DETECTED Final   Bordetella pertussis NOT DETECTED NOT DETECTED Final   Chlamydophila pneumoniae NOT DETECTED NOT DETECTED Final   Mycoplasma pneumoniae NOT DETECTED NOT DETECTED Final     Comment: Performed at [facility], [place] [address] [place], [place] [address]     Studies: No results found.  Scheduled Meds: . azithromycin  500 mg Intravenous Q24H  . cefTRIAXone (ROCEPHIN)  IV  1 g Intravenous Q24H  . clonazePAM  1 mg Oral QHS  . darifenacin  7.5 mg Oral Daily  . enoxaparin (LOVENOX) injection  40 mg Subcutaneous Q24H  . mouth rinse  15 mL Mouth Rinse BID  . polyethylene glycol  17 g Oral BH-q7a  . QUEtiapine  25 mg Oral QHS  . sodium chloride flush  3 mL  Intravenous Q12H    Continuous Infusions: . sodium chloride 100 mL/hr at 12/14/16 0122     Time spent: 47mins  Sanuel Ladnier MD, PhD  [facility] Pager [phone]. If 7PM-7AM, please contact night-coverage at [email or web address], password [facility] 12/14/2016, 2:31 PM  LOS: 2 days

## 2016-12-14 NOTE — Progress Notes (Addendum)
Pharmacy Antibiotic Follow-up Note  Luis Shaffer is a 71 y.o. year-old male admitted on 12/12/2016 with fever and AMS. The patient is currently on day 1 of Unasyn for aspiration PNA. Day 3 abx: Ceftriaxone & Azithromycin begun 3/6.  Assessment/Plan: Antibiotics changed to Unasyn  3gm q6h  Temp (24hrs), Avg:99 F (37.2 C), Min:98.5 F (36.9 C), Max:99.6 F (37.6 C)   Recent Labs Lab 12/12/16 1100 12/14/16 0708  WBC 14.4* 7.8    Recent Labs Lab 12/12/16 1100 12/12/16 1114 12/14/16 0708  CREATININE 0.91 0.90 0.74   Estimated Creatinine Clearance: 73.4 mL/min (by C-G formula based on SCr of 0.74 mg/dL).    No Known Allergies  3/6 Ceftriaxone >> 3/8 3/6  Azithromycin >> 3/8 3/8 Unasyn >>  Cultures 3/6 resp panel: neg 3/6 BCx: NGTD  Thank you for allowing pharmacy to be a part of this patient's care.  Minda Ditto PharmD Pager [phone] 12/14/2016, 3:09 PM

## 2016-12-14 NOTE — Progress Notes (Signed)
NUTRITION NOTE  Pt seen by RD yesterday. Per her documentation, pt with sever muscle depletion and 20% body weight loss in 1 year which places pt in dx for severe malnutrition in the context of chronic illness. Discussed with MD about this finding.  RD will continue to follow per protocol.   Jarome Matin, MS, RD, LDN, [facility] Clinical Dietitian Pager # [phone] After hours/weekend pager # [phone]

## 2016-12-15 ENCOUNTER — Inpatient Hospital Stay (HOSPITAL_COMMUNITY): Payer: Medicare Other

## 2016-12-15 LAB — LEGIONELLA PNEUMOPHILA SEROGP 1 UR AG: L. PNEUMOPHILA SEROGP 1 UR AG: NEGATIVE

## 2016-12-15 LAB — MRSA PCR SCREENING: MRSA BY PCR: NEGATIVE

## 2016-12-15 MED ORDER — BISACODYL 10 MG RE SUPP
10.0000 mg | Freq: Every day | RECTAL | Status: DC
  Administered 2016-12-15 – 2016-12-17 (×3): 10 mg via RECTAL
  Filled 2016-12-15 (×3): qty 1

## 2016-12-15 MED ORDER — MAGNESIUM SULFATE 2 GM/50ML IV SOLN
2.0000 g | Freq: Once | INTRAVENOUS | Status: AC
  Administered 2016-12-15: 2 g via INTRAVENOUS
  Filled 2016-12-15: qty 50

## 2016-12-15 MED ORDER — SODIUM CHLORIDE 0.9 % IV SOLN
30.0000 meq | Freq: Once | INTRAVENOUS | Status: AC
  Administered 2016-12-15: 30 meq via INTRAVENOUS
  Filled 2016-12-15: qty 15

## 2016-12-15 NOTE — Progress Notes (Signed)
Spoke with pt's son at bedside. Pt have 24/7 care at home there are no HH needs at present time.

## 2016-12-15 NOTE — Progress Notes (Addendum)
PROGRESS NOTE  Luis Shaffer LEX:[id number] DOB: [birth year] DOA: 12/12/2016 PCP: [facility]  HPI/Recap of past 24 hours:  Spike fever overnight, though Seems more alert today,   personal care giver and HCPOA  at bedside   Assessment/Plan: Principal Problem:   Aspiration pneumonia ([place]) Active Problems:   Multiple sclerosis ([place])   Sepsis ([place])   Dementia   CAP (community acquired pneumonia)   Aspiration pneumonia (HCC)/community-acquired pneumonia/hypoxic respiratory failure o2 sats 85 on room air on presentation Cxr: Patchy right lung base consolidation with faint air bronchograms, most compatible with pneumonia. Recommend follow-up PA and lateral post treatment chest radiographs in 4-6 weeks Suspect component of aspiration given the location of the pneumonia He is started on Rocephin azithromycin, urine strep antigen negative, abx changed to unasyn to better cover aspiration pneumonia Supportive care with nebulizer treatments  frank aspiration per speech, currently remain npo,  ( per HCPOA patient has been on regular diet, but they noticed difficulty in the last two weeks, per personal care giver, patient has been having swallow issues since January) I have discussed with patient's HCPOA, he is open to rediscuss with palliative care and hospice if patient does not improve on 3/10  Decreased urine output Suspect dehydration due to poor oral intake Continue IV fluids Condom catheter for strict I's and O's  Hypokalemia/hypomagnesemia: replace k and mag   Dementia/Multiple sclerosis (HCC)-hold outpatient medications until by mouth intake is established [employer], Seroquel until by mouth intake is established Family report patient was taken off  Exelon patch last year Per personal care giver, patient has been bedridden at baseline, last neurology follow up was in 2016, patient was put on hospice and discharged from hospice around 10-08/2016       DVT prophylaxis: Lovenox   Code Status: DNR  Family Communication: patient, personal caregiver and HCPOA at bedside  Disposition Plan: pending   Consultants:  Palliative care  Procedures:  none  Antibiotics:  Rocephin/zithro from admission to 3/8  unasyn from 3/8 to    Objective: BP (!) 106/50 (BP Location: Right Arm)   Pulse 75   Temp 98.1 F (36.7 C) (Oral)   Resp 20   Ht 5\' 10"  (1.778 m)   Wt 62.2 kg (137 lb 2 oz)   SpO2 97%   BMI 19.68 kg/m   Intake/Output Summary (Last 24 hours) at 12/15/16 1032 Last data filed at 12/15/16 0609  Gross per 24 hour  Intake             2550 ml  Output             1125 ml  Net             1425 ml   Filed Weights   12/12/16 1039 12/12/16 1712  Weight: 77.1 kg (170 lb) 62.2 kg (137 lb 2 oz)    Exam:   General: very Frail, chronically ill, follow commands inconsistantly  Cardiovascular: RRR  Respiratory: CTABL  Abdomen: Soft/ND/NT, positive BS  Musculoskeletal: No Edema  Neuro: patient is awake, does not speak,  follow commands inconsistantly   Data Reviewed: Basic Metabolic Panel:  Recent Labs Lab 12/12/16 1100 12/12/16 1114 12/14/16 0708  NA 140 142 142  K 3.9 3.8 3.4*  CL 107 103 110  CO2 25  --  24  GLUCOSE 121* 121* 75  BUN 15 15 10   CREATININE 0.91 0.90 0.74  CALCIUM 8.8*  --  8.1*  MG 1.5*  --  1.5*  Liver Function Tests:  Recent Labs Lab 12/12/16 1100  AST 24  ALT 29  ALKPHOS 70  BILITOT 1.1  PROT 6.8  ALBUMIN 3.5   No results for input(s): LIPASE, AMYLASE in the last 168 hours. No results for input(s): AMMONIA in the last 168 hours. CBC:  Recent Labs Lab 12/12/16 1100 12/12/16 1114 12/14/16 0708  WBC 14.4*  --  7.8  NEUTROABS 12.9*  --  5.9  HGB 13.0 13.3 10.9*  HCT 38.9* 39.0 33.3*  MCV 94.0  --  92.5  PLT 284  --  279   Cardiac Enzymes:   No results for input(s): CKTOTAL, CKMB, CKMBINDEX, TROPONINI in the last 168 hours. BNP (last 3 results) No results  for input(s): BNP in the last 8760 hours.  ProBNP (last 3 results) No results for input(s): PROBNP in the last 8760 hours.  CBG:  Recent Labs Lab 12/12/16 1050  GLUCAP 117*    Recent Results (from the past 240 hour(s))  Blood Culture (routine x 2)     Status: None (Preliminary result)   Collection Time: 12/12/16 11:01 AM  Result Value Ref [place] Status   Specimen Description BLOOD LEFT WRIST  Final   Special Requests BOTTLES DRAWN AEROBIC AND ANAEROBIC 5ML  Final   Culture   Final    NO GROWTH 2 DAYS Performed at [facility], [place] [address] [place], [place] [address]    Report Status PENDING  Incomplete  Blood Culture (routine x 2)     Status: None (Preliminary result)   Collection Time: 12/12/16 11:01 AM  Result Value Ref [place] Status   Specimen Description BLOOD RIGHT FOREARM  Final   Special Requests BOTTLES DRAWN AEROBIC AND ANAEROBIC 5ML  Final   Culture   Final    NO GROWTH 2 DAYS Performed at [facility], [place] [address] [place], [place] [address]    Report Status PENDING  Incomplete  Respiratory Panel by PCR     Status: None   Collection Time: 12/12/16  3:31 PM  Result Value Ref Range Status   Adenovirus NOT DETECTED NOT DETECTED Final   Coronavirus 229E NOT DETECTED NOT DETECTED Final   Coronavirus HKU1 NOT DETECTED NOT DETECTED Final   Coronavirus NL63 NOT DETECTED NOT DETECTED Final   Coronavirus OC43 NOT DETECTED NOT DETECTED Final   Metapneumovirus NOT DETECTED NOT DETECTED Final   Rhinovirus / Enterovirus NOT DETECTED NOT DETECTED Final   Influenza A NOT DETECTED NOT DETECTED Final   Influenza B NOT DETECTED NOT DETECTED Final   Parainfluenza Virus 1 NOT DETECTED NOT DETECTED Final   Parainfluenza Virus 2 NOT DETECTED NOT DETECTED Final   Parainfluenza Virus 3 NOT DETECTED NOT DETECTED Final   Parainfluenza Virus 4 NOT DETECTED NOT DETECTED Final   Respiratory Syncytial Virus NOT DETECTED NOT DETECTED Final   Bordetella pertussis NOT  DETECTED NOT DETECTED Final   Chlamydophila pneumoniae NOT DETECTED NOT DETECTED Final   Mycoplasma pneumoniae NOT DETECTED NOT DETECTED Final    Comment: Performed at [facility], [place] [address] [place], [place] [address]     Studies: No results found.  Scheduled Meds: . ampicillin-sulbactam (UNASYN) IV  3 g Intravenous Q6H  . clonazePAM  1 mg Oral QHS  . darifenacin  7.5 mg Oral Daily  . enoxaparin (LOVENOX) injection  40 mg Subcutaneous Q24H  . mouth rinse  15 mL Mouth Rinse BID  . polyethylene glycol  17 g Oral BH-q7a  .  QUEtiapine  25 mg Oral QHS  . sodium chloride flush  3 mL Intravenous Q12H    Continuous Infusions: . sodium chloride 100 mL/hr at 12/15/16 0001     Time spent: 14mins  Kandas Oliveto MD, PhD  [facility] Pager [phone]. If 7PM-7AM, please contact night-coverage at [email or web address], password [facility] 12/15/2016, 10:32 AM  LOS: 3 days

## 2016-12-15 NOTE — Progress Notes (Addendum)
SLP went to room to speak to caregiver [facility]) or nephew regarding pt's PLOF but nephew nor caregiver present at the time.  Will continue efforts. Luanna Salk, [place] [facility] SLP [phone]

## 2016-12-15 NOTE — Care Management Important Message (Signed)
Important Message  Patient Details  Name: Luis Shaffer MRN: [id number] Date of Birth: Feb 28, 1946   Medicare Important Message Given:  Yes    Purcell Mouton, RN 12/15/2016, 3:04 PM

## 2016-12-15 NOTE — Evaluation (Signed)
Clinical/Bedside Swallow Evaluation Patient Details  Name: Luis Shaffer MRN: [id number] Date of Birth: 1946-07-17  Today's Date: 12/15/2016 Time: SLP Start Time (ACUTE ONLY): 1129 SLP Stop Time (ACUTE ONLY): 1154 SLP Time Calculation (min) (ACUTE ONLY): 25 min  Past Medical History:  Past Medical History:  Diagnosis Date  . Dementia   . Hernia   . High blood pressure   . Multiple sclerosis ([place])   . Paresthesia    Past Surgical History:  Past Surgical History:  Procedure Laterality Date  . GALLBLADDER SURGERY  2003   HPI:  71 yo admit with respiratory difficulties, diagnosed with asp pna.  Per chart, pt was coughing excessively and then vomited with possible emesis aspiration.  PMH + for MS, gait disturance, decrease po intake, dementia.  Pt caregiver [name] present during evaluation.  20% weight loss over one year documented by RT.     Assessment / Plan / Recommendation Clinical Impression  Pt with gross weakness, congested cough and suspected aspiration of secretions likely lodged in his hypopharynx at time consistent with symptoms of severe dysphagia.   He did awaken, followed directions and readily accepted po trials.  SLP provided oral care - using toothette and oral suction resulting in removal of viscous white tinged secretions.    Pt given a boluses of water and nectar thickened juice via tsp and cup.  Oral weakness likely results in delay oral transiting and premature spillage.  Laryngeal elevation observed clinically was impaired/sluggish.  Post-swallow overt non-productive cough observed following all cup sip bolus - suspect due to weakness/aspiration.  He did not expectorate secretions on command.  Limited po provided due to pt's outward signs of aspiration.   Pt likely with sensory deficits and thus is possibly aspirating even tsp boluses.  As caregiver [name] reports pt has been coughing with intake since January (progressively worsening) suspect acute on chronic  dysphagia/aspiration due to dementia/MS.    Pt may benefit from MBS next date if fully alert to allow instrumental evaluation.  Uncertain if MBS will change care plan but would provide family information.   Recommend pt be allowed ice chips and tsps of water pending definitive decisions.  SLP will follow up next date for potential MBS.  Informed caregiver [name] and she texted pt's nephew and HCPOA stated information.   SLP Visit Diagnosis: Dysphagia, oropharyngeal phase (R13.12)    Aspiration Risk  Risk for inadequate nutrition/hydration;Severe aspiration risk (note pt with 20% weight loss within the last year)    Diet Recommendation Ice chips PRN after oral care (tsps of water only when fully upright and as tolerated)   Liquid Administration via: Spoon Medication Administration: Via alternative means    Other  Recommendations Oral Care Recommendations: Oral care QID   Follow up Recommendations  (TBD)      Frequency and Duration     TBD       Prognosis Prognosis for Safe Diet Advancement: Guarded Barriers to Reach Goals: Cognitive deficits;Severity of deficits;Time post onset      Swallow Study   General Date of Onset: 12/15/16 HPI: 71 yo admit with respiratory difficulties, diagnosed with asp pna.  Per chart, pt was coughing excessively and then vomited with possible emesis aspiration.  PMH + for MS, gait disturance, decrease po intake, dementia.  Pt caregiver [name] present during evaluation.  20% weight loss over one year documented by RT.   Type of Study: Bedside Swallow Evaluation Diet Prior to this Study: NPO Temperature Spikes Noted: Yes Respiratory  Status: Room air History of Recent Intubation: No Behavior/Cognition: Alert;Doesn't follow directions Oral Cavity Assessment: Dried secretions;Dry Oral Care Completed by SLP: Yes Oral Cavity - Dentition: Poor condition Vision: Impaired for self-feeding Self-Feeding Abilities: Total assist Patient Positioning: Upright in  bed Baseline Vocal Quality: Low vocal intensity;Suspected CN X (Vagus) involvement;Hoarse Volitional Cough: Weak Volitional Swallow: Unable to elicit    Oral/Motor/Sensory Function Overall Oral Motor/Sensory Function: Generalized oral weakness (pt is grossly weak, open mouth breathing posture and severe dysarthria)   Ice Chips Ice chips: Not tested   Thin Liquid Thin Liquid: Impaired Presentation: Cup;Spoon Oral Phase Impairments: Reduced labial seal;Reduced lingual movement/coordination Oral Phase Functional Implications: Prolonged oral transit Pharyngeal  Phase Impairments: Suspected delayed Swallow;Decreased hyoid-laryngeal movement;Multiple swallows;Cough - Delayed;Cough - Immediate    Nectar Thick Nectar Thick Liquid: Impaired Presentation: Cup;Spoon Oral Phase Impairments: Reduced labial seal;Reduced lingual movement/coordination Oral phase functional implications: Prolonged oral transit Pharyngeal Phase Impairments: Suspected delayed Swallow;Decreased hyoid-laryngeal movement;Multiple swallows;Cough - Immediate;Cough - Delayed   Honey Thick Honey Thick Liquid: Not tested   Puree Puree: Not tested   Solid   GO   Solid: Not tested        Luanna Salk, [place] [facility] SLP [phone]

## 2016-12-16 DIAGNOSIS — E43 Unspecified severe protein-calorie malnutrition: Secondary | ICD-10-CM

## 2016-12-16 DIAGNOSIS — Z7189 Other specified counseling: Secondary | ICD-10-CM

## 2016-12-16 DIAGNOSIS — Z515 Encounter for palliative care: Secondary | ICD-10-CM

## 2016-12-16 LAB — CBC WITH DIFFERENTIAL/PLATELET
Basophils Absolute: 0 K/uL (ref 0.0–0.1)
Basophils Relative: 0 %
Eosinophils Absolute: 0 K/uL (ref 0.0–0.7)
Eosinophils Relative: 0 %
HCT: 32.6 % — ABNORMAL LOW (ref 39.0–52.0)
Hemoglobin: 11.1 g/dL — ABNORMAL LOW (ref 13.0–17.0)
Lymphocytes Relative: 10 %
Lymphs Abs: 1.6 K/uL (ref 0.7–4.0)
MCH: 31.6 pg (ref 26.0–34.0)
MCHC: 34 g/dL (ref 30.0–36.0)
MCV: 92.9 fL (ref 78.0–100.0)
Monocytes Absolute: 0.8 K/uL (ref 0.1–1.0)
Monocytes Relative: 5 %
Neutro Abs: 13.9 K/uL — ABNORMAL HIGH (ref 1.7–7.7)
Neutrophils Relative %: 85 %
Platelets: 304 K/uL (ref 150–400)
RBC: 3.51 MIL/uL — ABNORMAL LOW (ref 4.22–5.81)
RDW: 13.2 % (ref 11.5–15.5)
WBC: 16.4 K/uL — ABNORMAL HIGH (ref 4.0–10.5)

## 2016-12-16 LAB — BASIC METABOLIC PANEL WITH GFR
Anion gap: 3 — ABNORMAL LOW (ref 5–15)
BUN: 12 mg/dL (ref 6–20)
CO2: 23 mmol/L (ref 22–32)
Calcium: 8 mg/dL — ABNORMAL LOW (ref 8.9–10.3)
Chloride: 119 mmol/L — ABNORMAL HIGH (ref 101–111)
Creatinine, Ser: 0.74 mg/dL (ref 0.61–1.24)
GFR calc Af Amer: >60 mL/min
GFR calc non Af Amer: >60 mL/min
Glucose, Bld: 112 mg/dL — ABNORMAL HIGH (ref 65–99)
Potassium: 3.7 mmol/L (ref 3.5–5.1)
Sodium: 145 mmol/L (ref 135–145)

## 2016-12-16 LAB — MAGNESIUM: Magnesium: 2 mg/dL (ref 1.7–2.4)

## 2016-12-16 MED ORDER — STARCH (THICKENING) PO POWD
ORAL | Status: DC | PRN
  Filled 2016-12-16: qty 227

## 2016-12-16 NOTE — Progress Notes (Addendum)
PROGRESS NOTE  Harkirat Orozco RSW:[id number] DOB: 11-May-1946 DOA: 12/12/2016 PCP: [facility]  HPI/Recap of past 24 hours:  tmax 100 last 24hrs, continue to seem to improve, more alert and interactive, start to mumble a few words,  Multiple family members in room   Assessment/Plan: Principal Problem:   Aspiration pneumonia ([place]) Active Problems:   Multiple sclerosis ([place])   Sepsis ([place])   Dementia   Community acquired pneumonia of right lower lobe of lung ([place])   Protein-calorie malnutrition, severe   Encounter for palliative care   Goals of care, counseling/discussion   Aspiration pneumonia (HCC)/community-acquired pneumonia/hypoxic respiratory failure o2 sats 85 on room air on presentation Cxr: Patchy right lung base consolidation with faint air bronchograms, most compatible with pneumonia. Recommend follow-up PA and lateral post treatment chest radiographs in 4-6 weeks Suspect component of aspiration given the location of the pneumonia He is started on Rocephin azithromycin, urine strep antigen negative, abx changed to unasyn to better cover aspiration pneumonia Supportive care with nebulizer treatments  ( per HCPOA patient has been on regular diet, but they noticed difficulty in the last two weeks, per personal care giver, patient has been having swallow issues since January) Patient seems more alert, started on puree diet/honey thick liquid, continue aspiration precaution  Decreased urine output Suspect dehydration due to poor oral intake Continue IV fluids Condom catheter for strict I's and O's  Hypokalemia/hypomagnesemia: replace k and mag   Dementia/Multiple sclerosis (HCC)-hold outpatient medications until by mouth intake is established [employer], Seroquel until by mouth intake is established Family report patient was taken off  Exelon patch last year Per personal care giver, patient has been bedridden at baseline, last neurology follow up  was in 2016, patient was put on hospice and discharged from hospice around 10-08/2016    residential hospice vs home hospice, palliative care input appreciated  DVT prophylaxis: Lovenox   Code Status: DNR  Family Communication: patient, personal caregiver and HCPOA at bedside  Disposition Plan:  residential hospice vs home hospice  Consultants:  Palliative care  Procedures:  none  Antibiotics:  Rocephin/zithro from admission to 3/8  unasyn from 3/8 to    Objective: BP (!) 120/58 (BP Location: Right Arm)   Pulse 86   Temp 99.5 F (37.5 C) (Axillary)   Resp 20   Ht 5\' 10"  (1.778 m)   Wt 62.2 kg (137 lb 2 oz)   SpO2 93%   BMI 19.68 kg/m   Intake/Output Summary (Last 24 hours) at 12/16/16 1356 Last data filed at 12/16/16 1107  Gross per 24 hour  Intake             3200 ml  Output              445 ml  Net             2755 ml   Filed Weights   12/12/16 1039 12/12/16 1712  Weight: 77.1 kg (170 lb) 62.2 kg (137 lb 2 oz)    Exam:   General: very Frail, chronically ill, follow commands inconsistantly, seems to be more alert, start to mumble a few words  Cardiovascular: RRR  Respiratory: CTABL  Abdomen: Soft/ND/NT, positive BS  Musculoskeletal: No Edema  Neuro: patient is awake, previously does not talk, today on 3/10, start to mumble a few words,  follow commands inconsistantly   Data Reviewed: Basic Metabolic Panel:  Recent Labs Lab 12/12/16 1100 12/12/16 1114 12/14/16 0708 12/16/16 0531  NA 140  142 142 145  K 3.9 3.8 3.4* 3.7  CL 107 103 110 119*  CO2 25  --  24 23  GLUCOSE 121* 121* 75 112*  BUN 15 15 10 12   CREATININE 0.91 0.90 0.74 0.74  CALCIUM 8.8*  --  8.1* 8.0*  MG 1.5*  --  1.5* 2.0   Liver Function Tests:  Recent Labs Lab 12/12/16 1100  AST 24  ALT 29  ALKPHOS 70  BILITOT 1.1  PROT 6.8  ALBUMIN 3.5   No results for input(s): LIPASE, AMYLASE in the last 168 hours. No results for input(s): AMMONIA in the last 168  hours. CBC:  Recent Labs Lab 12/12/16 1100 12/12/16 1114 12/14/16 0708 12/16/16 0531  WBC 14.4*  --  7.8 16.4*  NEUTROABS 12.9*  --  5.9 13.9*  HGB 13.0 13.3 10.9* 11.1*  HCT 38.9* 39.0 33.3* 32.6*  MCV 94.0  --  92.5 92.9  PLT 284  --  279 304   Cardiac Enzymes:   No results for input(s): CKTOTAL, CKMB, CKMBINDEX, TROPONINI in the last 168 hours. BNP (last 3 results) No results for input(s): BNP in the last 8760 hours.  ProBNP (last 3 results) No results for input(s): PROBNP in the last 8760 hours.  CBG:  Recent Labs Lab 12/12/16 1050  GLUCAP 117*    Recent Results (from the past 240 hour(s))  Blood Culture (routine x 2)     Status: None (Preliminary result)   Collection Time: 12/12/16 11:01 AM  Result Value Ref Range Status   Specimen Description BLOOD LEFT WRIST  Final   Special Requests BOTTLES DRAWN AEROBIC AND ANAEROBIC 5ML  Final   Culture   Final    NO GROWTH 4 DAYS Performed at [facility], [place] [address] [place], [place] [address]    Report Status PENDING  Incomplete  Blood Culture (routine x 2)     Status: None (Preliminary result)   Collection Time: 12/12/16 11:01 AM  Result Value Ref Range Status   Specimen Description BLOOD RIGHT FOREARM  Final   Special Requests BOTTLES DRAWN AEROBIC AND ANAEROBIC 5ML  Final   Culture   Final    NO GROWTH 4 DAYS Performed at [facility], [place] [address], [place], [place] [address]    Report Status PENDING  Incomplete  Respiratory Panel by PCR     Status: None   Collection Time: 12/12/16  3:31 PM  Result Value Ref Range Status   Adenovirus NOT DETECTED NOT DETECTED Final   Coronavirus 229E NOT DETECTED NOT DETECTED Final   Coronavirus HKU1 NOT DETECTED NOT DETECTED Final   Coronavirus NL63 NOT DETECTED NOT DETECTED Final   Coronavirus OC43 NOT DETECTED NOT DETECTED Final   Metapneumovirus NOT DETECTED NOT DETECTED Final   Rhinovirus / Enterovirus NOT DETECTED NOT DETECTED Final   Influenza A  NOT DETECTED NOT DETECTED Final   Influenza B NOT DETECTED NOT DETECTED Final   Parainfluenza Virus 1 NOT DETECTED NOT DETECTED Final   Parainfluenza Virus 2 NOT DETECTED NOT DETECTED Final   Parainfluenza Virus 3 NOT DETECTED NOT DETECTED Final   Parainfluenza Virus 4 NOT DETECTED NOT DETECTED Final   Respiratory Syncytial Virus NOT DETECTED NOT DETECTED Final   Bordetella pertussis NOT DETECTED NOT DETECTED Final   Chlamydophila pneumoniae NOT DETECTED NOT DETECTED Final   Mycoplasma pneumoniae NOT DETECTED NOT DETECTED Final    Comment: Performed at [facility], [place] [address] [place], [place] [address]  MRSA PCR Screening     Status: None   Collection Time: 12/15/16  9:04 AM  Result Value Ref Range Status   MRSA by PCR NEGATIVE NEGATIVE Final    Comment:        The GeneXpert MRSA Assay (FDA approved for NASAL specimens only), is one component of a comprehensive MRSA colonization surveillance program. It is not intended to diagnose MRSA infection nor to guide or monitor treatment for MRSA infections.      Studies: No results found.  Scheduled Meds: . ampicillin-sulbactam (UNASYN) IV  3 g Intravenous Q6H  . bisacodyl  10 mg Rectal Daily  . clonazePAM  1 mg Oral QHS  . darifenacin  7.5 mg Oral Daily  . enoxaparin (LOVENOX) injection  40 mg Subcutaneous Q24H  . mouth rinse  15 mL Mouth Rinse BID  . polyethylene glycol  17 g Oral BH-q7a  . QUEtiapine  25 mg Oral QHS  . sodium chloride flush  3 mL Intravenous Q12H    Continuous Infusions: . sodium chloride 100 mL/hr at 12/16/16 1107     Time spent: 40mins  Clydean Posas MD, PhD  [facility] Pager [phone]. If 7PM-7AM, please contact night-coverage at [email or web address], password [facility] 12/16/2016, 1:56 PM  LOS: 4 days

## 2016-12-16 NOTE — Progress Notes (Signed)
  Speech Language Pathology Treatment: Dysphagia  Patient Details Name: Luis Shaffer MRN: [id number] DOB: 03/11/1946 Today's Date: 12/16/2016 Time: 3794-4461 SLP Time Calculation (min) (ACUTE ONLY): 51 min  Assessment / Plan / Recommendation Clinical Impression  SLP followed up for continued PO trials to determine PO readiness. Pt seen upright in bed with family caregiver present. Oral care provided with xerostomia noted and dried secretions. Pt appeared to have natural dentition in upper right side of maxilla, hanging down, worrisome to fall out/aspiration risk during PO consumption, notified MD. Pt mentation with advanced cognitive decline prohibiting ability to follow commands consistently. Pt with oral discoordination across consistencies with intermittent anterior spillage and prolonged oral tranist resulting in suspected delay in swallow initiation. Overt s/sx of suspected reduced airway protection noted with thin and nectar thick trials c/b consistent delayed coughing . Honey thick liquids by cup and teaspoon along with puree trials without cough however delayed throat clear and intermittent wet vocal quality noted. Note palliative encounter with wishes for nonaggresive measures and safest PO option. Pts multiple medical comorbiities including MS, decreased respiratory support, advanced cognitive decline, and immobility continue to put him at increased risk for aspiration. Recommend dysphagia 1 (puree) and honey thick liquids with medicines crushed in puree. Pt requires full feeding assistance/supervision with all PO. ST to closely monitor for diet tolerance.     HPI HPI: 71 yo admit with respiratory difficulties, diagnosed with asp pna.  Per chart, pt was coughing excessively and then vomited with possible emesis aspiration.  PMH + for MS, gait disturance, decrease po intake, dementia.  Pt caregiver Luis Shaffer present during evaluation.  20% weight loss over one year documented by RT.        SLP  Plan  Continue with current plan of care       Recommendations  Diet recommendations: Dysphagia 1 (puree);Honey-thick liquid Liquids provided via: Teaspoon;Cup Medication Administration: Crushed with puree Compensations: Minimize environmental distractions;Slow rate;Small sips/bites Postural Changes and/or Swallow Maneuvers: Seated upright 90 degrees;Upright 30-60 min after meal                Oral Care Recommendations: Oral care BID;Staff/trained caregiver to provide oral care Follow up Recommendations: 24 hour supervision/assistance SLP Visit Diagnosis: Dysphagia, oropharyngeal phase (R13.12) Plan: Continue with current plan of care       GO               Luis Chaco MA, [place] Pathologist    Luis Shaffer 12/16/2016, 1:58 PM

## 2016-12-16 NOTE — Consult Note (Signed)
Consultation Note Date: 12/16/2016   Patient Name: Luis Shaffer  DOB: [birth year]  MRN: [id number]  Age / Sex: 71 y.o., male  PCP: [facility] Referring Physician: Florencia Reasons, MD  Reason for Consultation: Establishing goals of care  HPI/Patient Profile: 71 y.o. male   admitted on 12/12/2016    Clinical Assessment and Goals of Care:  71 yo gentleman with dementia, lives at home with 24/7 care givers. His baseline is such that he requires assistance with ADLs, eating, able to verbalize very few words meaningfully, according to his caregivers. Patient was connected with [facility] for several months in 2017, then was graduated from hospice services. He is now admitted to the Hospitalist service with aspiration pneumonia, community acquired PNA, hypoxic resp failure. Patient has ongoing issues with dysphagia, remains NPO, not yet cleared by speech therapy.   A palliative consult has been placed for goals of care discussions.   The patient is resting in bed, I saw him in the evening on 12-15-16 and then again this am on 12-16-16. Today, he is some what more awake, has his eyes open, he is trying to say something. Caregivers are at the bedside, providing him with company and oral care.   Call placed and discussed with HCPOA nephew [name]. Patient had a good, helpful experience with hospice services. See discussions below, thank you for the consult.   HCPOA    SUMMARY OF RECOMMENDATIONS    Agree with DNR DNI Call placed and discussed with nephew HCPOA Jim: patient with serious episode of aspiration, underlying dementia. Was on home hospice for several months in 2017, graduated from hospice October 2017. Not yet cleared by speech, remains NPO  We discussed about disposition options: if the patient is able to tolerate safest possible PO diet, then we can look into home with hospice, if remains NPO and with  declining mental status, then will need residential hospice.   Continue to monitor hospital course to determine safest disposition  Code Status/Advance Care Planning:  DNR    Symptom Management:    continue current measures   Palliative Prophylaxis:   Aspiration   Psycho-social/Spiritual:   Desire for further Chaplaincy support:no  Additional Recommendations: Education on Hospice  Prognosis:   ? guarded  Discharge Planning: home with hospice versus residential hospice, based on hospital trajectory       Primary Diagnoses: Present on Admission: . Multiple sclerosis ([place]) . CAP (community acquired pneumonia)   I have reviewed the medical record, interviewed the patient and family, and examined the patient. The following aspects are pertinent.  Past Medical History:  Diagnosis Date  . Dementia   . Hernia   . High blood pressure   . Multiple sclerosis ([place])   . Paresthesia    Social History   Social History  . Marital status: Married    Spouse name: Kohrs Sauer  . Number of children: 0  . Years of education: college   Occupational History  .      Retired   Social History  Main Topics  . Smoking status: Former Smoker    Types: Cigarettes  . Smokeless tobacco: Never Used     Comment: Quit 40 years ago  . Alcohol use No  . Drug use: No  . Sexual activity: Not Asked   Other Topics Concern  . None   Social History Narrative   Patient is retired. College education.   Patient's wife passed away in 08/10/2015.   Right handed.   Caffeine- one cup of coffee daily.   Family History  Problem Relation Age of Onset  . Breast cancer Mother   . Bone cancer Mother   . Kidney failure Father    Scheduled Meds: . ampicillin-sulbactam (UNASYN) IV  3 g Intravenous Q6H  . bisacodyl  10 mg Rectal Daily  . clonazePAM  1 mg Oral QHS  . darifenacin  7.5 mg Oral Daily  . enoxaparin (LOVENOX) injection  40 mg Subcutaneous Q24H  . mouth rinse  15 mL Mouth Rinse  BID  . polyethylene glycol  17 g Oral BH-q7a  . QUEtiapine  25 mg Oral QHS  . sodium chloride flush  3 mL Intravenous Q12H   Continuous Infusions: . sodium chloride 100 mL/hr at 12/15/16 2257   PRN Meds:.acetaminophen **OR** acetaminophen, levalbuterol, ondansetron **OR** ondansetron (ZOFRAN) IV Medications Prior to Admission:  Prior to Admission medications   Medication Sig Start Date End Date Taking? Authorizing Provider  clonazePAM (KLONOPIN) 1 MG tablet Take 1 tablet (1 mg total) by mouth at bedtime. 08/09/15  Yes Marcial Pacas, MD  polyethylene glycol (MIRALAX / GLYCOLAX) packet Take 17 g by mouth every morning.   Yes Historical Provider, MD  QUEtiapine (SEROQUEL) 25 MG tablet Take 25 mg by mouth at bedtime.   Yes Historical Provider, MD  VESICARE 5 MG tablet Take 5 mg by mouth daily at 12 noon.  07/30/15  Yes Historical Provider, MD  rivastigmine (EXELON) 4.6 mg/24hr Place 1 patch (4.6 mg total) onto the skin daily. Patient not taking: Reported on 12/12/2016 08/24/15   Marcial Pacas, MD  rivastigmine (EXELON) 9.5 mg/24hr Place 1 patch (9.5 mg total) onto the skin daily. Patient not taking: Reported on 12/12/2016 08/24/15   Marcial Pacas, MD   No Known Allergies Review of Systems Non verbal  Physical Exam Frail chronically ill Mouth open Eyes open Shallow clear s1 s2 Abdomen soft No edema Dementia  Vital Signs: BP (!) 120/58 (BP Location: Right Arm)   Pulse 86   Temp 99.5 F (37.5 C) (Axillary)   Resp 20   Ht 5\' 10"  (1.778 m)   Wt 62.2 kg (137 lb 2 oz)   SpO2 95%   BMI 19.68 kg/m  Pain Assessment: PAINAD       SpO2: SpO2: 95 % O2 Device:SpO2: 95 % O2 Flow Rate: .O2 Flow Rate (L/min): 2 L/min  IO: Intake/output summary:  Intake/Output Summary (Last 24 hours) at 12/16/16 1049 Last data filed at 12/16/16 0600  Gross per 24 hour  Intake             2700 ml  Output              645 ml  Net             2055 ml    LBM: Last BM Date: 12/15/16 Baseline Weight: Weight: 77.1  kg (170 lb) Most recent weight: Weight: 62.2 kg (137 lb 2 oz)     Palliative Assessment/Data:   Flowsheet Rows   Flowsheet Row Most Recent  Value  Intake Tab  Referral Department  Hospitalist  Unit at Time of Referral  Med/Surg Unit  Palliative Care Primary Diagnosis  Other (Comment) [dementia asp pna ]  Date Notified  12/15/16  Palliative Care Type  New Palliative care  Reason for referral  Clarify Goals of Care, Counsel Regarding Hospice  Date of Admission  12/12/16  Date first seen by Palliative Care  12/16/16  # of days IP prior to Palliative referral  3  Clinical Assessment  Palliative Performance Scale Score  10%  Pain Max last 24 hours  5  Pain Min Last 24 hours  4  Dyspnea Max Last 24 Hours  4  Dyspnea Min Last 24 hours  3  Nausea Max Last 24 Hours  4  Nausea Min Last 24 Hours  3  Psychosocial & Spiritual Assessment  Palliative Care Outcomes  Patient/Family meeting held?  Yes  Who was at the meeting?  hcpoa nephew [name]   Palliative Care Outcomes  Clarified goals of care      Time In:  9 Time Out:  10.10 Time Total:  70 minutes  Greater than 50%  of this time was spent counseling and coordinating care related to the above assessment and plan.  Signed by: Loistine Chance, MD  [phone]  Please contact Palliative Medicine Team phone at [phone] for questions and concerns.  For individual provider: See Shea Evans

## 2016-12-17 LAB — CBC
HCT: 29.5 % — ABNORMAL LOW (ref 39.0–52.0)
Hemoglobin: 9.9 g/dL — ABNORMAL LOW (ref 13.0–17.0)
MCH: 31.5 pg (ref 26.0–34.0)
MCHC: 33.6 g/dL (ref 30.0–36.0)
MCV: 93.9 fL (ref 78.0–100.0)
PLATELETS: 277 10*3/uL (ref 150–400)
RBC: 3.14 MIL/uL — AB (ref 4.22–5.81)
RDW: 13.2 % (ref 11.5–15.5)
WBC: 11.9 10*3/uL — AB (ref 4.0–10.5)

## 2016-12-17 LAB — BASIC METABOLIC PANEL
ANION GAP: 3 — AB (ref 5–15)
BUN: 10 mg/dL (ref 6–20)
CALCIUM: 7.8 mg/dL — AB (ref 8.9–10.3)
CO2: 26 mmol/L (ref 22–32)
Chloride: 120 mmol/L — ABNORMAL HIGH (ref 101–111)
Creatinine, Ser: 0.65 mg/dL (ref 0.61–1.24)
GFR calc Af Amer: >60 mL/min (ref >60)
Glucose, Bld: 94 mg/dL (ref 65–99)
POTASSIUM: 3 mmol/L — AB (ref 3.5–5.1)
SODIUM: 149 mmol/L — AB (ref 135–145)

## 2016-12-17 LAB — CULTURE, BLOOD (ROUTINE X 2)
Culture: NO GROWTH
Culture: NO GROWTH

## 2016-12-17 LAB — MAGNESIUM: Magnesium: 1.7 mg/dL (ref 1.7–2.4)

## 2016-12-17 MED ORDER — SODIUM CHLORIDE 0.9 % IV SOLN
30.0000 meq | INTRAVENOUS | Status: AC
  Administered 2016-12-17 (×2): 30 meq via INTRAVENOUS
  Filled 2016-12-17 (×3): qty 15

## 2016-12-17 NOTE — Progress Notes (Signed)
Pharmacy Antibiotic Note  Luis Shaffer is a 71 y.o. year-old male admitted on 12/12/2016 with fever and AMS. The patient is currently on day 1 of Unasyn for aspiration PNA. Day 3 abx: Ceftriaxone & Azithromycin begun 3/6.  Plan: D6 abx total D4 Unasyn 3g q6h Culture negative to date   Height: 5\' 10"  (177.8 cm) Weight: 137 lb 2 oz (62.2 kg) IBW/kg (Calculated) : 73  Temp (24hrs), Avg:99.1 F (37.3 C), Min:98.3 F (36.8 C), Max:100.3 F (37.9 C)   Recent Labs Lab 12/12/16 1100 12/12/16 1114 12/12/16 1310 12/12/16 1532 12/14/16 0708 12/16/16 0531 12/17/16 0504  WBC 14.4*  --   --   --  7.8 16.4* 11.9*  CREATININE 0.91 0.90  --   --  0.74 0.74 0.65  LATICACIDVEN  --  1.19 0.8 2.0*  --   --   --     Estimated Creatinine Clearance: 73.4 mL/min (by C-G formula based on SCr of 0.65 mg/dL).    No Known Allergies  Antimicrobials this admission: Ceftriaxone 3/6 >> 3/8 Azithromycin 3/6 >> 3/8 Unasyn 3/8 >>  Dose adjustments this admission: 3/8 PM: increased Unasyn from 1.5g to 3g q6h  Microbiology results: 3/6 resp panel: neg 3/6 BCx: NGTD  Thank you for allowing pharmacy to be a part of this patient's care.  Romeo Rabon, PharmD, pager [phone]. 12/17/2016,1:57 PM.

## 2016-12-17 NOTE — Clinical Social Work Note (Addendum)
CSW spoke to patient's caregiver and the family would like [facility] for residential hospice placement.  CSW made referral to [facility] liason.  Hospital Liason said there are not any beds available today, but have unit CSW follow up on Monday.  CSW was informed by patient's nephew [name] [phone] that if [facility] does not have any beds available on Monday, he would consider [facility] [place] as a second choice, and then [place] as a third choice.  Jones Broom. [place], MSW, [place]  12/17/2016 2:39 PM

## 2016-12-17 NOTE — Progress Notes (Addendum)
PROGRESS NOTE  Arion Shankles XAJ:[id number] DOB: November 28, 1945 DOA: 12/12/2016 PCP: [facility]  HPI/Recap of past 24 hours:  tmax 100.3 last 24hrs,  Although he showed improvement the last few days,  This morning he is worse with drowsiness, increased oxygen requirement, he does not open eyes to voice, he does not follow commands   Assessment/Plan: Principal Problem:   Aspiration pneumonia ([place]) Active Problems:   Multiple sclerosis ([place])   Sepsis ([place])   Dementia   Community acquired pneumonia of right lower lobe of lung ([place])   Protein-calorie malnutrition, severe   Encounter for palliative care   Goals of care, counseling/discussion   Aspiration pneumonia (HCC)/community-acquired pneumonia/hypoxic respiratory failure o2 sats 85 on room air on presentation Cxr: Patchy right lung base consolidation with faint air bronchograms, most compatible with pneumonia. Recommend follow-up PA and lateral post treatment chest radiographs in 4-6 weeks Suspect component of aspiration given the location of the pneumonia He is started on Rocephin azithromycin, urine strep antigen negative, abx changed to unasyn to better cover aspiration pneumonia Supportive care with nebulizer treatments  ( per HCPOA patient has been on regular diet, but they noticed difficulty in the last two weeks, per personal care giver, patient has been having swallow issues since January) Although he showed improvement the last few days,  This morning he is worse with drowsiness, increased oxygen requirement, he does not open eyes to voice, he does not follow commands I have talked to palliative care Dr Geraldo Pitter and talked to patient's Sydnee Cabal , we all agree that patient needs residential hospice placement. Social worker consulted for residential hospice placement.  Hypokalemia/hypomagnesemia: replace k/mag  Decreased urine output Suspect dehydration due to poor oral intake on IV  fluids  Hypokalemia/hypomagnesemia: replace k and mag   Dementia/Multiple sclerosis (HCC)-hold outpatient medications until by mouth intake is established Hold Klonopin, Seroquel until by mouth intake is established Family report patient was taken off  Exelon patch last year Per personal care giver, patient has been bedridden at baseline, last neurology follow up was in 2016, patient was put on hospice and discharged from hospice around 10-08/2016      DVT prophylaxis while in the hospital: Lovenox   Code Status: DNR  Family Communication: patient, personal caregiver at bedside and HCPOA over the phone  Disposition Plan:  residential hospice   Consultants:  Palliative care  Procedures:  none  Antibiotics:  Rocephin/zithro from admission to 3/8  unasyn from 3/8 to    Objective: BP 118/69 (BP Location: Right Arm)   Pulse 84   Temp 98.4 F (36.9 C) (Axillary)   Resp 20   Ht 5\' 10"  (1.778 m)   Wt 62.2 kg (137 lb 2 oz)   SpO2 93%   BMI 19.68 kg/m   Intake/Output Summary (Last 24 hours) at 12/17/16 1154 Last data filed at 12/17/16 0418  Gross per 24 hour  Intake             1800 ml  Output              400 ml  Net             1400 ml   Filed Weights   12/12/16 1039 12/12/16 1712  Weight: 77.1 kg (170 lb) 62.2 kg (137 lb 2 oz)    Exam:   General: very Frail, chronically ill, look worse today, he does not follow commands, does not talk, does not open eyes to voice, he has increased  oxygen requirement.   Cardiovascular: RRR  Respiratory: diminished on the right lower base  Abdomen: Soft/ND/NT, positive BS  Musculoskeletal: No Edema  Neuro: look worse today, he does not follow commands, does not talk, does not open eyes to voice,   Data Reviewed: Basic Metabolic Panel:  Recent Labs Lab 12/12/16 1100 12/12/16 1114 12/14/16 0708 12/16/16 0531 12/17/16 0504  NA 140 142 142 145 149*  K 3.9 3.8 3.4* 3.7 3.0*  CL 107 103 110 119* 120*  CO2 25   --  24 23 26   GLUCOSE 121* 121* 75 112* 94  BUN 15 15 10 12 10   CREATININE 0.91 0.90 0.74 0.74 0.65  CALCIUM 8.8*  --  8.1* 8.0* 7.8*  MG 1.5*  --  1.5* 2.0 1.7   Liver Function Tests:  Recent Labs Lab 12/12/16 1100  AST 24  ALT 29  ALKPHOS 70  BILITOT 1.1  PROT 6.8  ALBUMIN 3.5   No results for input(s): LIPASE, AMYLASE in the last 168 hours. No results for input(s): AMMONIA in the last 168 hours. CBC:  Recent Labs Lab 12/12/16 1100 12/12/16 1114 12/14/16 0708 12/16/16 0531 12/17/16 0504  WBC 14.4*  --  7.8 16.4* 11.9*  NEUTROABS 12.9*  --  5.9 13.9*  --   HGB 13.0 13.3 10.9* 11.1* 9.9*  HCT 38.9* 39.0 33.3* 32.6* 29.5*  MCV 94.0  --  92.5 92.9 93.9  PLT 284  --  279 304 277   Cardiac Enzymes:   No results for input(s): CKTOTAL, CKMB, CKMBINDEX, TROPONINI in the last 168 hours. BNP (last 3 results) No results for input(s): BNP in the last 8760 hours.  ProBNP (last 3 results) No results for input(s): PROBNP in the last 8760 hours.  CBG:  Recent Labs Lab 12/12/16 1050  GLUCAP 117*    Recent Results (from the past 240 hour(s))  Blood Culture (routine x 2)     Status: None (Preliminary result)   Collection Time: 12/12/16 11:01 AM  Result Value Ref Range Status   Specimen Description BLOOD LEFT WRIST  Final   Special Requests BOTTLES DRAWN AEROBIC AND ANAEROBIC 5ML  Final   Culture   Final    NO GROWTH 4 DAYS Performed at [facility], [place] [address], [place], [place] [address]    Report Status PENDING  Incomplete  Blood Culture (routine x 2)     Status: None (Preliminary result)   Collection Time: 12/12/16 11:01 AM  Result Value Ref Range Status   Specimen Description BLOOD RIGHT FOREARM  Final   Special Requests BOTTLES DRAWN AEROBIC AND ANAEROBIC 5ML  Final   Culture   Final    NO GROWTH 4 DAYS Performed at [facility], [place] [address], [place], [place] [address]    Report Status PENDING  Incomplete  Respiratory Panel by PCR      Status: None   Collection Time: 12/12/16  3:31 PM  Result Value Ref Range Status   Adenovirus NOT DETECTED NOT DETECTED Final   Coronavirus 229E NOT DETECTED NOT DETECTED Final   Coronavirus HKU1 NOT DETECTED NOT DETECTED Final   Coronavirus NL63 NOT DETECTED NOT DETECTED Final   Coronavirus OC43 NOT DETECTED NOT DETECTED Final   Metapneumovirus NOT DETECTED NOT DETECTED Final   Rhinovirus / Enterovirus NOT DETECTED NOT DETECTED Final   Influenza A NOT DETECTED NOT DETECTED Final   Influenza B NOT DETECTED NOT DETECTED Final   Parainfluenza Virus 1 NOT DETECTED NOT DETECTED  Final   Parainfluenza Virus 2 NOT DETECTED NOT DETECTED Final   Parainfluenza Virus 3 NOT DETECTED NOT DETECTED Final   Parainfluenza Virus 4 NOT DETECTED NOT DETECTED Final   Respiratory Syncytial Virus NOT DETECTED NOT DETECTED Final   Bordetella pertussis NOT DETECTED NOT DETECTED Final   Chlamydophila pneumoniae NOT DETECTED NOT DETECTED Final   Mycoplasma pneumoniae NOT DETECTED NOT DETECTED Final    Comment: Performed at [facility], [place] [address] [place], [place] [address]  MRSA PCR Screening     Status: None   Collection Time: 12/15/16  9:04 AM  Result Value Ref Range Status   MRSA by PCR NEGATIVE NEGATIVE Final    Comment:        The GeneXpert MRSA Assay (FDA approved for NASAL specimens only), is one component of a comprehensive MRSA colonization surveillance program. It is not intended to diagnose MRSA infection nor to guide or monitor treatment for MRSA infections.      Studies: No results found.  Scheduled Meds: . ampicillin-sulbactam (UNASYN) IV  3 g Intravenous Q6H  . bisacodyl  10 mg Rectal Daily  . clonazePAM  1 mg Oral QHS  . darifenacin  7.5 mg Oral Daily  . enoxaparin (LOVENOX) injection  40 mg Subcutaneous Q24H  . mouth rinse  15 mL Mouth Rinse BID  . polyethylene glycol  17 g Oral BH-q7a  . potassium chloride (KCL MULTIRUN) 30 mEq in 265 mL IVPB  30 mEq Intravenous  Q4H  . QUEtiapine  25 mg Oral QHS  . sodium chloride flush  3 mL Intravenous Q12H    Continuous Infusions: . sodium chloride 100 mL/hr at 12/17/16 1022     Time spent: 43mins, more than 50% time spent on coordination of care  Kinsleigh Ludolph MD, PhD  [facility] Pager [phone]. If 7PM-7AM, please contact night-coverage at [email or web address], password [facility] 12/17/2016, 11:54 AM  LOS: 5 days

## 2016-12-18 ENCOUNTER — Inpatient Hospital Stay (HOSPITAL_COMMUNITY): Payer: Medicare Other

## 2016-12-18 DIAGNOSIS — Z515 Encounter for palliative care: Secondary | ICD-10-CM

## 2016-12-18 MED ORDER — BISACODYL 10 MG RE SUPP: 10.0000 mg | Freq: Every day | RECTAL | 0 refills | Status: AC

## 2016-12-18 MED ORDER — ACETAMINOPHEN 650 MG RE SUPP: 650.0000 mg | Freq: Four times a day (QID) | RECTAL | 0 refills | Status: AC | PRN

## 2016-12-18 MED ORDER — SODIUM CHLORIDE 0.9 % IV BOLUS (SEPSIS)
500.0000 mL | Freq: Once | INTRAVENOUS | Status: AC
  Administered 2016-12-18: 500 mL via INTRAVENOUS

## 2016-12-18 MED ORDER — LORAZEPAM 2 MG/ML PO CONC: 0.5000 mg | Freq: Three times a day (TID) | ORAL | 0 refills | Status: AC | PRN

## 2016-12-18 NOTE — Consult Note (Signed)
[facility] room available for Luis Shaffer today. Nephew completed paper work this morning for transfer today. Dr. Orpah Melter to assume care. Discharge summary has been faxed.   RN please call report to [phone].  Thank you,  Luis Conte, LCSW [phone]

## 2016-12-18 NOTE — Progress Notes (Signed)
Report called to Nira Conn, RN at [facility].  Barbee Shro[place]. Brigitte Pulse, RN

## 2016-12-18 NOTE — Discharge Summary (Addendum)
Discharge Summary  Luis Shaffer JJK:[id number] DOB: 29-Jul-1946  PCP: Luis Shaffer date: 12/12/2016 Discharge date: 12/18/2016  Time spent: >59mins  Patient is discharged to residential hospice with comfort measures  Discharge Diagnoses:  Active Sh[facility]er Problems   Diagnosis Date Noted  . Aspiration pneumonia (Luis Shaffer) 12/12/2016  . Protein-calorie malnutrition, severe 12/16/2016  . Encounter for palliative care   . Goals of care, counseling/discussion   . Sepsis (Luis Shaffer) 12/12/2016  . Shaffer 12/12/2016  . Community acquired pneumonia of right lower lobe of lung (Luis Shaffer) 12/12/2016  . Multiple sclerosis Luis Shaffer)     Resolved [facility]er Problems   Diagnosis Date Noted Date Resolved  No resolved problems to display.    Discharge Condition: stable  Diet recommendation: comfort feeds  Filed Weights   12/12/16 1039 12/12/16 1712  Weight: 77.1 kg (170 lb) 62.2 kg (137 lb 2 oz)    History of present illness:  PCP: [facility]   Chief Complaint: Lethargy   HPI:   71 year old male with a history of Luis Shaffer, relapsing remitting multiple sclerosis since 1998, followed by Luis Shaffer, who was brought in today by caregiver and nephew who is power of attorney , for rapid decline, gait instability, poor oral intake, lethargy, altered mental status. Patient had a rectal temperature 104 this morning. Over the last 2 weeks caregiver has noted decreasing urine output, decreased bowel movements, less by mouth intake. Patient is not very communicative at baseline. According to the nephew patient was on hospice until October 2017. He was discharged from hospice because of his steady course and minimal decline. ED course Patient was found to be hypoxic on arrival and  85% on room air BP (!) 154/122 (BP Location: Right Arm)  Pulse 117  Temp 102.4 F (39.1 C) (Rectal)  Resp 20  Pro calcitonin 0.69, lactic acid 1.19, potassium  3.8. UA negative. CT head shows no acute abnormality. Chest x-ray compatible with right basilar pneumonia. Patient admitted for pneumonia   Shaffer Course:  Principal Problem:   Aspiration pneumonia (Luis Shaffer) Active Problems:   Multiple sclerosis (Luis Shaffer)   Sepsis (Luis Shaffer)   Shaffer   Community acquired pneumonia of right lower lobe of lung (Luis Shaffer)   Protein-calorie malnutrition, severe   Encounter for palliative care   Goals of care, counseling/discussion  Aspiration pneumonia (Luis Shaffer)/community-acquired pneumonia/hypoxic respiratory failure o2 sats 85 on room air on presentation Cxr: Patchy right lung base consolidation with faint air bronchograms, most compatible with pneumonia.  likely aspiration given the location of the pneumonia He was started on Rocephin azithromycin, urine strep antigen negative, abx changed to unasyn to better cover aspiration pneumonia Supportive care with nebulizer treatments  ( per HCPOA patient has been on regular diet, but they noticed difficulty in the last two weeks, per personal care giver, patient has been having swallow issues since January) Although he showed improvement the last few days,  This morning he is worse with drowsiness, increased oxygen requirement, he does not open eyes to voice, he does not follow commands I have talked to palliative care Dr Geraldo Pitter and talked to patient's Luis Shaffer , we all agree that patient needs residential hospice placement. Social worker consulted for residential hospice placement.  Hypokalemia/hypomagnesemia: replace k/mag  Decreased urine output Suspect dehydration due to poor oral intake on IV fluids  Hypokalemia/hypomagnesemia: replace k and mag  Shaffer/Multiple sclerosis (Luis Shaffer)-hold outpatient medications until by mouth intake is established Hold Klonopin, Seroquel until by mouth intake is established Family  report patient was taken off  Exelon patch last year Per personal care giver, patient has been  bedridden at baseline, last neurology follow up was in 2016, patient was put on hospice and discharged from hospice around 10-08/2016   Poor dentition/loose teeth: hospice facility to perform oral care, prevent patient aspirate on own teeth, I have discussed this with residential hospice liaison Luis Shaffer.  DVT prophylaxis while in the Shaffer:Lovenox   Code Status: Luis Shaffer  Family Communication: patient, personal caregiver at bedside and HCPOA over the phone  Disposition Plan: residential hospice with comfort measures  Consultants:  Palliative care  hospice  Procedures:  none  Antibiotics:  Rocephin/zithro from admission to 3/8  unasyn from 3/8 to    Discharge Exam: BP (!) 154/88 (BP Location: Right Arm)   Pulse 71   Temp 98.9 F (37.2 C) (Axillary)   Resp 18   Ht 5\' 10"  (1.778 m)   Wt 62.2 kg (137 lb 2 oz)   SpO2 93%   BMI 19.68 kg/m   General: very frail, intermittent lethargy, does not follow commands, does not talk Cardiovascular: RRR Respiratory: diminished at basis  Discharge Instructions You were cared for by a hospitalist during your Shaffer stay. If you have any questions about your discharge medications or the care you received while you were in the Shaffer after you are discharged, you can call the unit and asked to speak with the hospitalist on call if the hospitalist that took care of you is not available. Once you are discharged, your primary care physician will handle any further medical issues. Please note that NO REFILLS for any discharge medications will be authorized once you are discharged, as it is imperative that you return to your primary care physician (or establish a relationship with a primary care physician if you do not have one) for your aftercare needs so that they can reassess your need for medications and monitor your lab values.  Discharge Instructions    Diet general    Complete by:  As directed    Comfort feeds      Allergies as of 12/18/2016   No Known Allergies     Medication List    STOP taking these medications   clonazePAM 1 MG tablet Commonly known as:  KLONOPIN   polyethylene glycol packet Commonly known as:  MIRALAX / GLYCOLAX   QUEtiapine 25 MG tablet Commonly known as:  SEROQUEL   rivastigmine 4.6 mg/24hr Commonly known as:  EXELON   rivastigmine 9.5 mg/24hr Commonly known as:  EXELON   VESICARE 5 MG tablet Generic drug:  solifenacin     TAKE these medications   acetaminophen 650 MG suppository Commonly known as:  TYLENOL Place 1 suppository (650 mg total) rectally every 6 (six) hours as needed for mild pain (or Fever >/= 101).   bisacodyl 10 MG suppository Commonly known as:  DULCOLAX Place 1 suppository (10 mg total) rectally daily.   LORazepam 2 MG/ML concentrated solution Commonly known as:  ATIVAN Take 0.3 mLs (0.6 mg total) by mouth every 8 (eight) hours as needed for anxiety.      No Known Allergies    The results of significant diagnostics from this hospitalization (including imaging, microbiology, ancillary and laboratory) are listed below for reference.    Significant Diagnostic Studies: Dg Abd 1 View  Result Date: 12/15/2016 CLINICAL DATA:  Community acquired pneumonia. Now with lower abdominal pain. EXAM: ABDOMEN - 1 VIEW COMPARISON:  None. FINDINGS: Examination is minimally degraded due to  obliquity. Moderate colonic stool burden without evidence of enteric obstruction. Skin folds overlie the right upper abdominal quadrant. No pneumoperitoneum, pneumatosis or portal venous gas. Post cholecystectomy. Limited visualization of the lower thorax is unremarkable. Moderate bilateral facet degenerative change of the lower lumbar spine is suspected though incompletely evaluated. Multiple phleboliths overlie the lower pelvis. No additional definitive abnormal intra-abdominal calcifications given overlying stool burden. IMPRESSION: Moderate colonic stool burden  without evidence of enteric obstruction. Electronically Signed   By: Sandi Mariscal M.D.   On: 12/15/2016 11:03   Ct Head Wo Contrast  Result Date: 12/12/2016 CLINICAL DATA:  Fever.  Demented.  Altered mental status. EXAM: CT HEAD WITHOUT CONTRAST TECHNIQUE: Contiguous axial images were obtained from the base of the skull through the vertex without intravenous contrast. COMPARISON:  MRI of 08/20/2015 (report not available). Head CT of 08/11/2015. FINDINGS: Brain: Mild low density in the periventricular white matter likely related to small vessel disease. This is slightly progressive. Cerebellar atrophy is advanced. No mass lesion, hemorrhage, hydrocephalus, acute infarct, intra-axial, or extra-axial fluid collection. Vascular: Intracranial atherosclerosis. Skull: Normal Sinuses/Orbits: Normal orbits and globes. Right maxillary sinus mucous retention cyst or polyp. Clear mastoid air cells. Other: None IMPRESSION: 1.  No acute intracranial abnormality. 2. Age advanced cerebellar atrophy with small vessel ischemic change. 3. Sinus disease. Electronically Signed   By: Abigail Miyamoto M.D.   On: 12/12/2016 12:25   Dg Chest Port 1 View  Result Date: 12/18/2016 CLINICAL DATA:  Fever. EXAM: PORTABLE CHEST 1 VIEW COMPARISON:  12/12/2016. FINDINGS: Mediastinum and hilar structures are normal. Bilateral lower lobe pulmonary infiltrates noted. Low lung volumes with basilar atelectasis. Small bilateral pleural effusions. No pneumothorax. Borderline cardiomegaly. No pulmonary venous congestion. No acute bony abnormality. IMPRESSION: 1. Bilateral lower lobe pulmonary infiltrates most consistent pneumonia. Low lung volumes with basilar atelectasis. 2.  Borderline cardiomegaly.  No pulmonary venous congestion . Electronically Signed   By: Marcello Moores  Register   On: 12/18/2016 07:34   Dg Chest Port 1 View  Result Date: 12/12/2016 CLINICAL DATA:  Cough.  Fever. EXAM: PORTABLE CHEST 1 VIEW COMPARISON:  08/11/2015 chest radiograph.  FINDINGS: Stable cardiomediastinal silhouette with normal heart size. No pneumothorax. No pleural effusion. Patchy consolidation at the right lung base with faint air bronchograms. No pulmonary edema. IMPRESSION: Patchy right lung base consolidation with faint air bronchograms, most compatible with pneumonia. Recommend follow-up PA and lateral post treatment chest radiographs in 4-6 weeks. Electronically Signed   By: Ilona Sorrel M.D.   On: 12/12/2016 11:44    Microbiology: Recent Results (from the past 240 hour(s))  Blood Culture (routine x 2)     Status: None   Collection Time: 12/12/16 11:01 AM  Result Value Ref Range Status   Specimen Description BLOOD LEFT WRIST  Final   Special Requests BOTTLES DRAWN AEROBIC AND ANAEROBIC 5ML  Final   Culture   Final    NO GROWTH 5 DAYS Performed at [facility], [address] [place], [place] [address]    Report Status 12/17/2016 FINAL  Final  Blood Culture (routine x 2)     Status: None   Collection Time: 12/12/16 11:01 AM  Result Value Ref Range Status   Specimen Description BLOOD RIGHT FOREARM  Final   Special Requests BOTTLES DRAWN AEROBIC AND ANAEROBIC 5ML  Final   Culture   Final    NO GROWTH 5 DAYS Performed at [facility], [place] [address], [place],  [address]    Report  Status 12/17/2016 FINAL  Final  Respiratory Panel by PCR     Status: None   Collection Time: 12/12/16  3:31 PM  Result Value Ref Range Status   Adenovirus NOT DETECTED NOT DETECTED Final   Coronavirus 229E NOT DETECTED NOT DETECTED Final   Coronavirus HKU1 NOT DETECTED NOT DETECTED Final   Coronavirus NL63 NOT DETECTED NOT DETECTED Final   Coronavirus OC43 NOT DETECTED NOT DETECTED Final   Metapneumovirus NOT DETECTED NOT DETECTED Final   Rhinovirus / Enterovirus NOT DETECTED NOT DETECTED Final   Influenza A NOT DETECTED NOT DETECTED Final   Influenza B NOT DETECTED NOT DETECTED Final   Parainfluenza Virus 1 NOT DETECTED NOT DETECTED Final    Parainfluenza Virus 2 NOT DETECTED NOT DETECTED Final   Parainfluenza Virus 3 NOT DETECTED NOT DETECTED Final   Parainfluenza Virus 4 NOT DETECTED NOT DETECTED Final   Respiratory Syncytial Virus NOT DETECTED NOT DETECTED Final   Bordetella pertussis NOT DETECTED NOT DETECTED Final   Chlamydophila pneumoniae NOT DETECTED NOT DETECTED Final   Mycoplasma pneumoniae NOT DETECTED NOT DETECTED Final    Comment: Performed at [facility], [place] [address] [place], [place] [address]  MRSA PCR Screening     Status: None   Collection Time: 12/15/16  9:04 AM  Result Value Ref Range Status   MRSA by PCR NEGATIVE NEGATIVE Final    Comment:        The GeneXpert MRSA Assay (FDA approved for NASAL specimens only), is one component of a comprehensive MRSA colonization surveillance program. It is not intended to diagnose MRSA infection nor to guide or monitor treatment for MRSA infections.      Labs: Basic Metabolic Panel:  Recent Labs Lab 12/12/16 1100 12/12/16 1114 12/14/16 0708 12/16/16 0531 12/17/16 0504  NA 140 142 142 145 149*  K 3.9 3.8 3.4* 3.7 3.0*  CL 107 103 110 119* 120*  CO2 25  --  24 23 26   GLUCOSE 121* 121* 75 112* 94  BUN 15 15 10 12 10   CREATININE 0.91 0.90 0.74 0.74 0.65  CALCIUM 8.8*  --  8.1* 8.0* 7.8*  MG 1.5*  --  1.5* 2.0 1.7   Liver Function Tests:  Recent Labs Lab 12/12/16 1100  AST 24  ALT 29  ALKPHOS 70  BILITOT 1.1  PROT 6.8  ALBUMIN 3.5   No results for input(s): LIPASE, AMYLASE in the last 168 hours. No results for input(s): AMMONIA in the last 168 hours. CBC:  Recent Labs Lab 12/12/16 1100 12/12/16 1114 12/14/16 0708 12/16/16 0531 12/17/16 0504  WBC 14.4*  --  7.8 16.4* 11.9*  NEUTROABS 12.9*  --  5.9 13.9*  --   HGB 13.0 13.3 10.9* 11.1* 9.9*  HCT 38.9* 39.0 33.3* 32.6* 29.5*  MCV 94.0  --  92.5 92.9 93.9  PLT 284  --  279 304 277   Cardiac Enzymes: No results for input(s): CKTOTAL, CKMB, CKMBINDEX, TROPONINI in the last  168 hours. BNP: BNP (last 3 results) No results for input(s): BNP in the last 8760 hours.  ProBNP (last 3 results) No results for input(s): PROBNP in the last 8760 hours.  CBG:  Recent Labs Lab 12/12/16 1050  GLUCAP 117*       Signed:  Abayomi Pattison MD, PhD  [facility] 12/18/2016, 11:01 AM

## 2016-12-18 NOTE — Progress Notes (Signed)
Patient plan is to discharge to [facility] today. Patient has a bed available per hospice. Patient can transfer after paperwork completed to hospice home.  LCSW will arrange for transportation via EMS.   No other needs, family at bedside and agreeable to plan.  Lane Hacker, MSW Clinical Social Work: [employer] Cover[employer] for :  [phone]

## 2017-01-07 DEATH — deceased
